# Patient Record
Sex: Female | Born: 1950 | Race: White | Hispanic: No | Marital: Married | Smoking: Never smoker
Health system: Southern US, Community
[De-identification: ages and names within clinical notes are randomized; demographics above are authoritative.]

## PROBLEM LIST (undated history)

## (undated) DIAGNOSIS — E119 Type 2 diabetes mellitus without complications: Secondary | ICD-10-CM

## (undated) DIAGNOSIS — Z85820 Personal history of malignant melanoma of skin: Secondary | ICD-10-CM

## (undated) DIAGNOSIS — M797 Fibromyalgia: Secondary | ICD-10-CM

## (undated) DIAGNOSIS — M1990 Unspecified osteoarthritis, unspecified site: Secondary | ICD-10-CM

## (undated) DIAGNOSIS — I10 Essential (primary) hypertension: Secondary | ICD-10-CM

## (undated) HISTORY — PX: SKIN CANCER EXCISION: SHX779

## (undated) HISTORY — PX: CHOLECYSTECTOMY: SHX55

## (undated) HISTORY — PX: MELANOMA EXCISION: SHX5266

---

## 1997-11-14 ENCOUNTER — Ambulatory Visit (HOSPITAL_COMMUNITY): Admission: RE | Admit: 1997-11-14 | Discharge: 1997-11-14 | Payer: Self-pay | Admitting: Neurosurgery

## 2008-06-14 ENCOUNTER — Ambulatory Visit: Payer: Self-pay | Admitting: Gastroenterology

## 2008-07-03 ENCOUNTER — Ambulatory Visit: Payer: Self-pay | Admitting: Gastroenterology

## 2008-07-03 ENCOUNTER — Ambulatory Visit (HOSPITAL_COMMUNITY): Admission: RE | Admit: 2008-07-03 | Discharge: 2008-07-03 | Payer: Self-pay | Admitting: Gastroenterology

## 2008-07-03 ENCOUNTER — Encounter: Payer: Self-pay | Admitting: Gastroenterology

## 2008-07-05 ENCOUNTER — Encounter (INDEPENDENT_AMBULATORY_CARE_PROVIDER_SITE_OTHER): Payer: Self-pay

## 2008-07-18 ENCOUNTER — Encounter: Payer: Self-pay | Admitting: Gastroenterology

## 2010-08-01 LAB — GLUCOSE, CAPILLARY: Glucose-Capillary: 144 mg/dL — ABNORMAL HIGH (ref 70–99)

## 2010-09-03 NOTE — Consult Note (Signed)
Cassie Martinez, Cassie Martinez            ACCOUNT NO.:  [id number]   MEDICAL RECORD NO.:  [id number]          PATIENT TYPE:  AMB   LOCATION:  DAY                           FACILITY:  [facility]   PHYSICIAN:  Cassie Martinez, M.D.      DATE OF BIRTH:  22-Jan-1951   DATE OF CONSULTATION:  06/14/2008  DATE OF DISCHARGE:                                 CONSULTATION   PRIMARY CARE PHYSICIAN:  Cassie Nations, MD   REASON FOR VISIT:  Rectal bleeding.   HISTORY OF PRESENT ILLNESS:  Cassie Martinez is a 60 year old female who has  never had a colonoscopy.  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  She had rectal bleeding once.  She often has problem with hemorrhoids due to constipation caused by her  pain medicines.  She takes Lortab for her fibromyalgia and back pain.  She denies any change in her bowel habits, weight loss, problems  swallowing, nausea, vomiting, or diarrhea.  She has a bowel movement  every other day.  If she eats right, she has one daily.  She rarely  drinks water.  She denies any black stools.  She uses goody powders less  than once a month for headaches.  She uses ibuprofen less than once a  month.  Her appetite is very good.  Her heartburn is controlled 100%  of the time with omeprazole.  She has never had an upper endoscopy to  screen for Barrett esophagus.   PAST MEDICAL HISTORY:  1. Fibromyalgia.  2. Diabetes for 3 years.  3. Hypertension.  4. Hyperlipidemia.  5. GERD for 8 years.   PAST SURGICAL HISTORY:  1. Cholecystectomy in 2008 by Dr. Cleotis Nipper in [place] due to pain and      gallstones.  2. Left chest excision for melanoma.   ALLERGIES:  No known drug allergies.   MEDICATIONS:  1. Omeprazole 20 mg daily.  2. Triamterene/HCTZ 25/50 daily.  3. Fosinopril 10 mg daily.  4. Glimepiride 2 mg daily.  5. Lovastatin 40 mg at bedtime.  6. Calcium with vitamin D b.i.d.  7. Hydrocodone 5/500 mg 2-4 daily.  8. Ibuprofen as needed.  9.  Vitamin D weekly.   REVIEW OF SYSTEMS:  As per the HPI.  Otherwise, all systems are  negative.   FAMILY HISTORY:  Mother has polyps.  She has no family history of colon  cancer.   SOCIAL HISTORY:  She has been married for 39 years and her youngest  child is 20.  She was a homemaker and a Dispensing optician.  She does not smoke  or drink alcohol.   PHYSICAL EXAMINATION:  VITAL SIGNS:  Weight 218 pounds, height 5 feet 6  inches, temperature 97.9, blood pressure 124/82, and pulse 72.GENERAL:  She is in no apparent distress, alert, and oriented x4.HEENT:  Atraumatic and normocephalic.  Pupils are equal and reactive to light.  Mouth, no oral lesions.  Posterior pharynx without erythema or exudate.  Postsurgical changes in the right lower eyelid.NECK:  Full range of  motion.  No lymphadenopathy.CHEST:  Left chest excision  well  healed.LUNGS:  Clear to auscultation bilaterally.CARDIOVASCULAR:  Regular rhythm.  No murmur.  Normal S1 and S2.ABDOMEN:  Bowel sounds are  present.  Soft, nontender, and nondistended.  No rebound or  guarding.EXTREMITIES:  No cyanosis or edema.NEUROLOGIC:  She has no  focal neurologic deficits.   ASSESSMENT:  Cassie Martinez is a 60 year old female with gastroesophageal  reflux disease, which is fairly well controlled, and has never had  Barrett surveillance.  She has rectal bleeding, which is likely  secondary to hemorrhoids.  The differential diagnoses include colorectal  polyp or malignancy.   RECOMMENDATIONS:  1. Colonoscopy for rectal bleeding and upper endoscopy for GERD.  She      should hold the glimepiride on the day of her procedure.  2. I discussed the different preps that were available as well as the      risk associated with them.  She wants to proceed with the OsmoPrep.      She is asked to maintain hydration.  3. I explained to her, the body of evidence that is out there      regarding Barrett surveillance is rare and that adenocarcinoma      usually  occurs in 61 year old white males, but the risk is not 0%      especially with gastroesophageal reflux disease.  She elected to be      surveyed.  4. She will follow up with me as needed.      Cassie Martinez, M.D.  Electronically Signed     SM/MEDQ  D:  06/14/2008  T:  06/15/2008  Job:  [id number]   cc:   Cassie Martinez  Fax: [phone]

## 2010-09-03 NOTE — Op Note (Signed)
Cassie Martinez, Cassie Martinez            ACCOUNT NO.:  [id number]   MEDICAL RECORD NO.:  [id number]          PATIENT TYPE:  AMB   LOCATION:  DAY                           FACILITY:  [facility]   PHYSICIAN:  Kassie Mends, M.D.      DATE OF BIRTH:  02-Sep-1950   DATE OF PROCEDURE:  07/03/2008  DATE OF DISCHARGE:                               OPERATIVE REPORT   REFERRING Lathan Gieselman:  Kathlee Nations, MD, [place], [place].   PROCEDURE:  1. Ileocolonoscopy.  2. Esophagogastroduodenoscopy with cold forceps biopsy.   INDICATION FOR EXAM:  Cassie Martinez is a 60 year old female who presented  with rectal bleeding and a long history of heartburn.  Her heartburn was  well controlled and she never had surveillance for Barrett esophagus.   FINDINGS:  1. Normal terminal ileum approximately 10 cm visualized.  2. Normal colon without evidence of polyps, masses, inflammatory      changes, or diverticular AVMs.  3. Small internal hemorrhoids.  Otherwise, normal retroflex view of      the rectum.  4. Normal esophagus without evidence of Barrett, erosion, ulceration,      or stricture.  5. Moderate sliding hiatal hernia.  Multiple gastric polyps seen in      the proximal stomach.  The largest appeared to be approximately 8      mm.  Biopsies were obtained via cold forceps.  6. Patchy erythema in the antrum with rare erosion.  No ulcerations      seen.  7. Normal duodenal bulb in second portion of the duodenum.   DIAGNOSES:  1. Small internal hemorrhoids.  2. Gastric polyps, likely fundic gland polyps.  3. Moderate gastritis likely secondary to anti-inflammatory use.   RECOMMENDATIONS:  1. Will call Cassie Martinez with results of her biopsies.  2. No aspirin, NSAIDs, or anticoagulation for 7 days.  3. She was given a handout on gastritis and hiatal hernia.  4. She should follow a high-fiber diet.  She was given a handout on      high-fiber diet and hemorrhoids.  5. Screening colonoscopy in 10 years.   MEDICATIONS:  1. Demerol 100 mg IV.  2. Versed 7 mg IV.   PROCEDURE TECHNIQUE:  Physical exam was performed.  Informed consent was  obtained from the patient explaining the benefits, risks, and  alternatives to the procedure.  The patient was connected to monitor and  placed in left lateral position.  Continuous oxygen was provided by  nasal cannula and IV medicine administered through an indwelling  cannula.  After administration of sedation and rectal exam, the  patient's rectum was intubated.  The scope was advanced under direct  visualization to the distal terminal ileum.  The scope was removed  slowly by carefully examining the color, texture, anatomy, and integrity  mucosa on the way out.   After colonoscopy, the patient's esophagus was intubated with a  diagnostic gastroscope.  The scope advanced under direct visualization  to the second portion of the duodenum.  The scope was removed slowly by  careful examining the color, texture, anatomy, and integrity mucosa on  the way out.  The patient was recovered in Endoscopy and discharged home  in satisfactory condition.   PATH:  Chronic gastritis. Fundic gland polyps.      Kassie Mends, M.D.  Electronically Signed     SM/MEDQ  D:  07/03/2008  T:  07/03/2008  Job:  [id number]   cc:   Kathlee Nations  Fax: [phone]

## 2013-09-02 ENCOUNTER — Emergency Department (HOSPITAL_COMMUNITY): Payer: PRIVATE HEALTH INSURANCE

## 2013-09-02 ENCOUNTER — Encounter (HOSPITAL_COMMUNITY): Payer: Self-pay | Admitting: Emergency Medicine

## 2013-09-02 ENCOUNTER — Emergency Department (HOSPITAL_COMMUNITY)
Admission: EM | Admit: 2013-09-02 | Discharge: 2013-09-02 | Disposition: A | Discharge status: Home / Self Care | Payer: PRIVATE HEALTH INSURANCE | Attending: Emergency Medicine | Admitting: Emergency Medicine

## 2013-09-02 DIAGNOSIS — Z8739 Personal history of other diseases of the musculoskeletal system and connective tissue: Secondary | ICD-10-CM | POA: Insufficient documentation

## 2013-09-02 DIAGNOSIS — Y9301 Activity, walking, marching and hiking: Secondary | ICD-10-CM | POA: Insufficient documentation

## 2013-09-02 DIAGNOSIS — S52501A Unspecified fracture of the lower end of right radius, initial encounter for closed fracture: Secondary | ICD-10-CM | POA: Diagnosis present

## 2013-09-02 DIAGNOSIS — E119 Type 2 diabetes mellitus without complications: Secondary | ICD-10-CM | POA: Insufficient documentation

## 2013-09-02 DIAGNOSIS — Y92838 Other recreation area as the place of occurrence of the external cause: Secondary | ICD-10-CM

## 2013-09-02 DIAGNOSIS — IMO0002 Reserved for concepts with insufficient information to code with codable children: Secondary | ICD-10-CM | POA: Insufficient documentation

## 2013-09-02 DIAGNOSIS — S52509A Unspecified fracture of the lower end of unspecified radius, initial encounter for closed fracture: Secondary | ICD-10-CM | POA: Insufficient documentation

## 2013-09-02 DIAGNOSIS — W010XXA Fall on same level from slipping, tripping and stumbling without subsequent striking against object, initial encounter: Secondary | ICD-10-CM | POA: Insufficient documentation

## 2013-09-02 DIAGNOSIS — S52611A Displaced fracture of right ulna styloid process, initial encounter for closed fracture: Secondary | ICD-10-CM | POA: Diagnosis present

## 2013-09-02 DIAGNOSIS — Y9239 Other specified sports and athletic area as the place of occurrence of the external cause: Secondary | ICD-10-CM | POA: Insufficient documentation

## 2013-09-02 DIAGNOSIS — S52609A Unspecified fracture of lower end of unspecified ulna, initial encounter for closed fracture: Principal | ICD-10-CM

## 2013-09-02 DIAGNOSIS — I10 Essential (primary) hypertension: Secondary | ICD-10-CM | POA: Insufficient documentation

## 2013-09-02 HISTORY — DX: Unspecified osteoarthritis, unspecified site: M19.90

## 2013-09-02 HISTORY — DX: Type 2 diabetes mellitus without complications: E11.9

## 2013-09-02 HISTORY — DX: Essential (primary) hypertension: I10

## 2013-09-02 MED ORDER — HYDROMORPHONE HCL PF 1 MG/ML IJ SOLN
0.5000 mg | Freq: Once | INTRAMUSCULAR | Status: AC
  Administered 2013-09-02: 0.5 mg via INTRAMUSCULAR
  Filled 2013-09-02: qty 1

## 2013-09-02 MED ORDER — OXYCODONE HCL 5 MG PO CAPS: 10.0000 mg | ORAL_CAPSULE | ORAL | Status: DC | PRN

## 2013-09-02 MED ORDER — OXYCODONE HCL 5 MG PO CAPS: 5.0000 mg | ORAL_CAPSULE | ORAL | Status: DC | PRN

## 2013-09-02 MED ORDER — METHOCARBAMOL 750 MG PO TABS: 750.0000 mg | ORAL_TABLET | Freq: Four times a day (QID) | ORAL | Status: DC

## 2013-09-02 MED ORDER — METHOCARBAMOL 750 MG PO TABS: 750.0000 mg | ORAL_TABLET | Freq: Four times a day (QID) | ORAL | Status: AC

## 2013-09-02 NOTE — Consult Note (Signed)
Reason for Consult: Fracture right wrist Referring Physician: ER staff Dr. Hayden RasmussenHarrison  Cassie Lynnea FerrierSolomon is an 63 y.o. female.  HPI: Patient presents after a fall onto her outstretched extremity prints his right upper extremity) she's. Patient injured her facial region with abrasion and contusion to her nose and lip. She also sustained a fracture to her radius. She notes no instability or prior history of injury to the wrist that she is aware of. She denies abdominal pain chest pain neck or back pain she denies lower extremity pain.  She was on a field trip with her granddaughter and fell as she tripped over a root.  She is otherwise healthy. She is delightful and from [place]  Past Medical History  Diagnosis Date  . Arthritis   . Hypertension   . Diabetes mellitus without complication     History reviewed. No pertinent past surgical history.  History reviewed. No pertinent family history.  Social History:  reports that she has never smoked. She does not have any smokeless tobacco history on file. She reports that she does not drink alcohol or use illicit drugs.  Allergies: No Known Allergies  Medications: I have reviewed the patient's current medications.  No results found for this or any previous visit (from the past 48 hour(s)).  Dg Forearm Right  09/02/2013   CLINICAL DATA:  Pain post trauma  EXAM: RIGHT FOREARM - 2 VIEW  COMPARISON:  Right wrist obtained earlier in the day  FINDINGS: Frontal and lateral views were obtained. There is a comminuted fracture of the distal radial metaphysis mild impaction as well as lateral displacement distal fracture fragments and mouth volar angulation distally. Ulnar styloid is avulsed. No other fractures. No dislocation. No evidence of elbow joint effusion.  IMPRESSION: Comminuted fracture distal radius.  Avulsion ulnar styloid.   Electronically Signed   By: Bretta BangWilliam  Woodruff M.D.   On: 09/02/2013 14:57   Dg Wrist Complete  Right  09/02/2013   CLINICAL DATA:  Pain post trauma  EXAM: RIGHT WRIST - COMPLETE 3+ VIEW  COMPARISON:  None.  FINDINGS: Frontal, oblique, lateral, and ulnar deviation scaphoid images were obtained. There is a comminuted fracture the distal radial metaphysis with mild lateral displacement of the distal major fracture fragments and mild impaction and volar angulation at the fracture site. There are fractures extending into the radiocarpal joint. There is avulsion of the ulnar styloid. No dislocation. There is osteoarthritic change in the scaphotrapezial and saddle joints.  IMPRESSION: Comminuted fracture distal radius. Avulsion ulnar styloid. No dislocation.   Electronically Signed   By: Bretta BangWilliam  Woodruff M.D.   On: 09/02/2013 14:56   Dg Hand Complete Right  09/02/2013   CLINICAL DATA:  Pain post trauma  EXAM: RIGHT HAND - COMPLETE 3+ VIEW  COMPARISON:  None.  FINDINGS: Frontal, oblique, and lateral views were obtained. There is a comminuted fracture of the distal radial metaphysis. There is avulsion of the ulnar styloid. It the hand region, there is no appreciable fracture or dislocation. There is mild to moderate narrowing of all PIP and DIP joints. There is osteoarthritic change in the scaphotrapezial and saddle joints.  IMPRESSION: Fractures of the distal radius and ulna. Multilevel osteoarthritic change.   Electronically Signed   By: Bretta BangWilliam  Woodruff M.D.   On: 09/02/2013 14:54    Review of Systems  Constitutional: Negative.   HENT: Negative.   Eyes: Negative.   Respiratory: Negative.   Cardiovascular: Negative.   Gastrointestinal: Negative.   Neurological: Negative.  Psychiatric/Behavioral: Negative.    Blood pressure 146/70, pulse 86, temperature 97.6 F (36.4 C), temperature source Oral, resp. rate 15, SpO2 96.00%. Physical Exam white female with facial contusions and abrasions. She has swelling about her nose and lip. There is no difficulty exchanging air no shortness of breath or  wheeze.  She has a swollen painful wrist right upper extremity no laceration or abrasion I washed this with soap and water and treated the skin. Elbow and upper arm or nontender she has normal sensation normal pulse no signs of compartment syndrome or dystrophic reaction  Chest is clear HEENT is reviewed at length as noted above. Abdomen is nontender nondistended obese. Lower stem examination is benign without palpable deformity or abnormality.  Pelvis is stable.  Left upper extremity is neurovascular intact    Assessment/Plan: #1 Comminuted right wrist fracture will plan for CT scan splinting (this was done today by myself) and I will call her and arrange for elective reconstruction. #2 facial trauma with contusion and abrasions  I discussed the patient all issues. She understands we will treat the face with Neosporin and topical measures. For the wrist all 10 a CT scan g tonight on call after I review the CT scan we'll make definitive care plans. She understands we'll likely plan to proceed with surgical stabilization given her fractures  Pattern  Keep bandage clean and dry.  Call for any problems.  No smoking.  Criteria for driving a car: you should be off your pain medicine for 7-8 hours, able to drive one handed(confident), thinking clearly and feeling able in your judgement to drive. Continue elevation as it will decrease swelling.  If instructed by MD move your fingers within the confines of the bandage/splint.  Use ice if instructed by your MD. Call immediately for any sudden loss of feeling in your hand/arm or change in functional abilities of the extremity.  We recommend that you to take vitamin C 1000 mg a day to promote healing we also recommend that if you require her pain medicine that he take a stool softener to prevent constipation as most pain medicines will have constipation side effects. We recommend either Peri-Colace or Senokot and recommend that you also consider adding  MiraLAX to prevent the constipation affects from pain medicine if you are required to use them. These medicines are over the counter and maybe purchased at a local pharmacy.   Dominica SeverinWilliam Tejay Hubert 09/02/2013, 4:41 PM

## 2013-09-02 NOTE — ED Notes (Signed)
Pt transported to CT ?

## 2013-09-02 NOTE — Discharge Instructions (Signed)
Please elevate keep your bandage clean and dry and move your fingers frequently. We recommend ice and elevation and finger movement and massage to decrease edema/swelling.  We recommend a stool softener for your GI system to prevent constipation while on pain medicine.  Dr. Amanda PeaGramig will call you after he reviews her CT scan  We recommend that you to take vitamin C 1000 mg a day to promote healing we also recommend that if you require her pain medicine that he take a stool softener to prevent constipation as most pain medicines will have constipation side effects. We recommend either Peri-Colace or Senokot and recommend that you also consider adding MiraLAX to prevent the constipation affects from pain medicine if you are required to use them. These medicines are over the counter and maybe purchased at a local pharmacy.   Keep bandage clean and dry.  Call for any problems.  No smoking.  Criteria for driving a car: you should be off your pain medicine for 7-8 hours, able to drive one handed(confident), thinking clearly and feeling able in your judgement to drive. Continue elevation as it will decrease swelling.  If instructed by MD move your fingers within the confines of the bandage/splint.  Use ice if instructed by your MD. Call immediately for any sudden loss of feeling in your hand/arm or change in functional abilities of the extremity.  Please continue to apply Neosporin and large with soap and water to your facial lacerations and abrasions

## 2013-09-02 NOTE — ED Provider Notes (Signed)
CSN: [id number]     Arrival date & time 09/02/13  1328 History   First MD Initiated Contact with Patient 09/02/13 1333     Chief Complaint  Patient presents with  . Wrist Injury     (Consider location/radiation/quality/duration/timing/severity/associated sxs/prior Treatment) Patient is a 63 y.o. female presenting with wrist injury. The history is provided by the patient.  Wrist Injury Location:  Wrist Time since incident:  1 hour Injury: yes   Mechanism of injury: fall   Fall:    Fall occurred: from standing.   Impact surface: ground.   Point of impact: right hand, face.   Entrapped after fall: no   Wrist location:  R wrist Pain details:    Quality:  Aching   Radiates to:  Does not radiate   Severity:  Mild   Onset quality:  Sudden   Duration:  1 hour   Timing:  Constant   Progression:  Unchanged Chronicity:  New Tetanus status:  Up to date Prior injury to area:  No Relieved by: tramadol x 2. Worsened by:  Nothing tried Ineffective treatments:  None tried Associated symptoms: no back pain, no fatigue, no fever and no neck pain     Past Medical History  Diagnosis Date  . Arthritis   . Hypertension   . Diabetes mellitus without complication    History reviewed. No pertinent past surgical history. History reviewed. No pertinent family history. History  Substance Use Topics  . Smoking status: Never Smoker   . Smokeless tobacco: Not on file  . Alcohol Use: No   OB History   Grav Para Term Preterm Abortions TAB SAB Ect Mult Living                 Review of Systems  Constitutional: Negative for fever and fatigue.  HENT: Negative for congestion and drooling.   Eyes: Negative for pain.  Respiratory: Negative for cough and shortness of breath.   Cardiovascular: Negative for chest pain.  Gastrointestinal: Negative for nausea, vomiting, abdominal pain and diarrhea.  Genitourinary: Negative for dysuria and hematuria.  Musculoskeletal: Negative for back pain, gait  problem and neck pain.  Skin: Negative for color change.  Neurological: Negative for dizziness and headaches.  Hematological: Negative for adenopathy.  Psychiatric/Behavioral: Negative for behavioral problems.  All other systems reviewed and are negative.     Allergies  Review of patient's allergies indicates no known allergies.  Home Medications   Prior to Admission medications   Not on File   BP 141/65  Pulse 92  Temp(Src) 97.9 F (36.6 C) (Oral)  Resp 16  SpO2 96% Physical Exam  Nursing note and vitals reviewed. Constitutional: She is oriented to person, place, and time. She appears well-developed and well-nourished.  HENT:  Head: Normocephalic.  Right Ear: External ear normal.  Left Ear: External ear normal.  Mouth/Throat: Oropharynx is clear and moist. No oropharyngeal exudate.  Mild abrasion to the bridge of the nose. No septal hematoma noted.  Mild swelling and abrasion to the right upper lip.  Normal dentition and appearance of the oropharynx.  No focal tenderness to palpation of the face.  Eyes: Conjunctivae and EOM are normal. Pupils are equal, round, and reactive to light.  Neck: Normal range of motion. Neck supple.  No vertebral tenderness to palpation.  Cardiovascular: Normal rate, regular rhythm, normal heart sounds and intact distal pulses.  Exam reveals no gallop and no friction rub.   No murmur heard. Pulmonary/Chest: Effort normal and breath sounds  normal. No respiratory distress. She has no wheezes.  Abdominal: Soft. Bowel sounds are normal. There is no tenderness. There is no rebound and no guarding.  Musculoskeletal: Normal range of motion. She exhibits no edema and no tenderness.  Gross deformity to the right wrist.  Motor skills and sensation intact in the right upper extremity.  2+ distal pulses in the right upper extremity.  Neurological: She is alert and oriented to person, place, and time.  Skin: Skin is warm and dry.  Psychiatric: She  has a normal mood and affect. Her behavior is normal.    ED Course  Procedures (including critical care time) Labs Review Labs Reviewed - No data to display  Imaging Review Dg Forearm Right  09/02/2013   CLINICAL DATA:  Pain post trauma  EXAM: RIGHT FOREARM - 2 VIEW  COMPARISON:  Right wrist obtained earlier in the day  FINDINGS: Frontal and lateral views were obtained. There is a comminuted fracture of the distal radial metaphysis mild impaction as well as lateral displacement distal fracture fragments and mouth volar angulation distally. Ulnar styloid is avulsed. No other fractures. No dislocation. No evidence of elbow joint effusion.  IMPRESSION: Comminuted fracture distal radius.  Avulsion ulnar styloid.   Electronically Signed   By: Bretta Bang M.D.   On: 09/02/2013 14:57   Dg Wrist Complete Right  09/02/2013   CLINICAL DATA:  Pain post trauma  EXAM: RIGHT WRIST - COMPLETE 3+ VIEW  COMPARISON:  None.  FINDINGS: Frontal, oblique, lateral, and ulnar deviation scaphoid images were obtained. There is a comminuted fracture the distal radial metaphysis with mild lateral displacement of the distal major fracture fragments and mild impaction and volar angulation at the fracture site. There are fractures extending into the radiocarpal joint. There is avulsion of the ulnar styloid. No dislocation. There is osteoarthritic change in the scaphotrapezial and saddle joints.  IMPRESSION: Comminuted fracture distal radius. Avulsion ulnar styloid. No dislocation.   Electronically Signed   By: Bretta Bang M.D.   On: 09/02/2013 14:56   Dg Hand Complete Right  09/02/2013   CLINICAL DATA:  Pain post trauma  EXAM: RIGHT HAND - COMPLETE 3+ VIEW  COMPARISON:  None.  FINDINGS: Frontal, oblique, and lateral views were obtained. There is a comminuted fracture of the distal radial metaphysis. There is avulsion of the ulnar styloid. It the hand region, there is no appreciable fracture or dislocation. There is  mild to moderate narrowing of all PIP and DIP joints. There is osteoarthritic change in the scaphotrapezial and saddle joints.  IMPRESSION: Fractures of the distal radius and ulna. Multilevel osteoarthritic change.   Electronically Signed   By: Bretta Bang M.D.   On: 09/02/2013 14:54     EKG Interpretation None      MDM   Final diagnoses:  Distal radius fracture, right  Fracture of right ulnar styloid    1:43 PM 63 y.o. female who presents after a mechanical fall in the park. She fell forward breaking herself with her outstretched hands. She has an abrasion to the bridge of her nose and a deformity to her right wrist. She denies any headache or neck pain. She is afebrile and vital signs are unremarkable here. No gross deformity to nose without significant tenderness to palpation. Will get screening imaging of the wrist do not think facial imaging is needed. Tetanus UTD per pt.   Discussed case w/ Dr. Amanda Pea who will see the pt in the ED. Return precautions provided. Pt to  f/u w/ Dr. Amanda PeaGramig.     Junius ArgyleForrest S Deryl Ports, MD 09/05/13 [id number]

## 2013-09-02 NOTE — ED Notes (Addendum)
Per EMS pt reports walking through park and tripped over tree stump, reports trying to use right wrist to break her fall. Pt has laceration to lip and nose.

## 2013-09-02 NOTE — ED Notes (Signed)
Bed: [id number] Expected date:  Expected time:  Means of arrival:  Comments: ems- elderly, fall, wrist injury

## 2013-09-02 NOTE — ED Notes (Signed)
Pt in X ray

## 2013-09-03 ENCOUNTER — Encounter (HOSPITAL_COMMUNITY): Payer: No Typology Code available for payment source | Admitting: Anesthesiology

## 2013-09-03 ENCOUNTER — Ambulatory Visit (HOSPITAL_COMMUNITY): Payer: No Typology Code available for payment source | Admitting: Anesthesiology

## 2013-09-03 ENCOUNTER — Encounter (HOSPITAL_COMMUNITY)
Admission: EM | Disposition: A | Discharge status: Home / Self Care | Payer: Self-pay | Source: Ambulatory Visit | Attending: Orthopedic Surgery

## 2013-09-03 ENCOUNTER — Observation Stay (HOSPITAL_COMMUNITY)
Admission: EM | Admit: 2013-09-03 | Discharge: 2013-09-04 | Disposition: A | Discharge status: Home / Self Care | Payer: No Typology Code available for payment source | Source: Ambulatory Visit | Attending: Orthopedic Surgery | Admitting: Orthopedic Surgery

## 2013-09-03 ENCOUNTER — Encounter (HOSPITAL_COMMUNITY): Payer: Self-pay | Admitting: *Deleted

## 2013-09-03 DIAGNOSIS — I509 Heart failure, unspecified: Secondary | ICD-10-CM | POA: Insufficient documentation

## 2013-09-03 DIAGNOSIS — S52509A Unspecified fracture of the lower end of unspecified radius, initial encounter for closed fracture: Principal | ICD-10-CM | POA: Insufficient documentation

## 2013-09-03 DIAGNOSIS — E119 Type 2 diabetes mellitus without complications: Secondary | ICD-10-CM | POA: Insufficient documentation

## 2013-09-03 DIAGNOSIS — IMO0001 Reserved for inherently not codable concepts without codable children: Secondary | ICD-10-CM | POA: Insufficient documentation

## 2013-09-03 DIAGNOSIS — S52609A Unspecified fracture of lower end of unspecified ulna, initial encounter for closed fracture: Principal | ICD-10-CM

## 2013-09-03 DIAGNOSIS — W19XXXA Unspecified fall, initial encounter: Secondary | ICD-10-CM | POA: Diagnosis not present

## 2013-09-03 DIAGNOSIS — I10 Essential (primary) hypertension: Secondary | ICD-10-CM | POA: Insufficient documentation

## 2013-09-03 DIAGNOSIS — I252 Old myocardial infarction: Secondary | ICD-10-CM | POA: Insufficient documentation

## 2013-09-03 DIAGNOSIS — S52599A Other fractures of lower end of unspecified radius, initial encounter for closed fracture: Secondary | ICD-10-CM | POA: Diagnosis present

## 2013-09-03 DIAGNOSIS — S52501A Unspecified fracture of the lower end of right radius, initial encounter for closed fracture: Secondary | ICD-10-CM

## 2013-09-03 HISTORY — PX: ORIF WRIST FRACTURE: SHX2133

## 2013-09-03 HISTORY — DX: Fibromyalgia: M79.7

## 2013-09-03 HISTORY — DX: Personal history of malignant melanoma of skin: Z85.820

## 2013-09-03 LAB — GLUCOSE, CAPILLARY
GLUCOSE-CAPILLARY: 148 mg/dL — AB (ref 70–99)
Glucose-Capillary: 153 mg/dL — ABNORMAL HIGH (ref 70–99)
Glucose-Capillary: 138 mg/dL — ABNORMAL HIGH (ref 70–99)

## 2013-09-03 SURGERY — OPEN REDUCTION INTERNAL FIXATION (ORIF) WRIST FRACTURE
Anesthesia: Monitor Anesthesia Care | Laterality: Right

## 2013-09-03 MED ORDER — CEFAZOLIN SODIUM-DEXTROSE 2-3 GM-% IV SOLR
INTRAVENOUS | Status: AC
  Filled 2013-09-03: qty 50

## 2013-09-03 MED ORDER — LIDOCAINE HCL (CARDIAC) 20 MG/ML IV SOLN
INTRAVENOUS | Status: AC
  Filled 2013-09-03: qty 5

## 2013-09-03 MED ORDER — ACETAMINOPHEN 325 MG PO TABS: 325.0000 mg | ORAL_TABLET | ORAL | Status: DC | PRN

## 2013-09-03 MED ORDER — PROPOFOL 10 MG/ML IV BOLUS
INTRAVENOUS | Status: DC | PRN
  Administered 2013-09-03 (×2): 20 mg via INTRAVENOUS

## 2013-09-03 MED ORDER — ONDANSETRON HCL 4 MG PO TABS
4.0000 mg | ORAL_TABLET | Freq: Four times a day (QID) | ORAL | Status: DC | PRN
  Administered 2013-09-04: 4 mg via ORAL
  Filled 2013-09-03: qty 1

## 2013-09-03 MED ORDER — LACTATED RINGERS IV SOLN
INTRAVENOUS | Status: DC
  Administered 2013-09-03: 12:00:00 via INTRAVENOUS

## 2013-09-03 MED ORDER — METHOCARBAMOL 1000 MG/10ML IJ SOLN
500.0000 mg | Freq: Four times a day (QID) | INTRAVENOUS | Status: DC | PRN
  Filled 2013-09-03: qty 5

## 2013-09-03 MED ORDER — PROMETHAZINE HCL 25 MG/ML IJ SOLN: 6.2500 mg | INTRAMUSCULAR | Status: DC | PRN

## 2013-09-03 MED ORDER — CEFAZOLIN SODIUM 1-5 GM-% IV SOLN
INTRAVENOUS | Status: AC
  Administered 2013-09-03: 1 g via INTRAVENOUS
  Filled 2013-09-03: qty 50

## 2013-09-03 MED ORDER — OXYCODONE HCL 5 MG PO TABS
ORAL_TABLET | ORAL | Status: AC
Start: 2013-09-03 — End: 2013-09-03
  Administered 2013-09-03: 5 mg via ORAL
  Filled 2013-09-03: qty 1

## 2013-09-03 MED ORDER — FENTANYL CITRATE 0.05 MG/ML IJ SOLN: 25.0000 ug | INTRAMUSCULAR | Status: DC | PRN

## 2013-09-03 MED ORDER — KETOROLAC TROMETHAMINE 30 MG/ML IJ SOLN
INTRAMUSCULAR | Status: AC
  Administered 2013-09-03: 30 mg via INTRAVENOUS
  Filled 2013-09-03: qty 1

## 2013-09-03 MED ORDER — DIPHENHYDRAMINE HCL 25 MG PO CAPS: 25.0000 mg | ORAL_CAPSULE | Freq: Four times a day (QID) | ORAL | Status: DC | PRN

## 2013-09-03 MED ORDER — CEFAZOLIN SODIUM 1-5 GM-% IV SOLN
1.0000 g | INTRAVENOUS | Status: AC
  Administered 2013-09-03: 1 g via INTRAVENOUS

## 2013-09-03 MED ORDER — ONDANSETRON HCL 4 MG/2ML IJ SOLN: 4.0000 mg | Freq: Four times a day (QID) | INTRAMUSCULAR | Status: DC | PRN

## 2013-09-03 MED ORDER — SIMVASTATIN 20 MG PO TABS
20.0000 mg | ORAL_TABLET | Freq: Every day | ORAL | Status: DC
Start: 2013-09-03 — End: 2013-09-04
  Administered 2013-09-03: 20 mg via ORAL
  Filled 2013-09-03 (×2): qty 1

## 2013-09-03 MED ORDER — LACTATED RINGERS IV SOLN
INTRAVENOUS | Status: DC | PRN
  Administered 2013-09-03: 15:00:00 via INTRAVENOUS

## 2013-09-03 MED ORDER — CEFAZOLIN SODIUM 1-5 GM-% IV SOLN
1.0000 g | Freq: Three times a day (TID) | INTRAVENOUS | Status: DC
  Administered 2013-09-03 – 2013-09-04 (×3): 1 g via INTRAVENOUS
  Filled 2013-09-03 (×5): qty 50

## 2013-09-03 MED ORDER — LISINOPRIL 10 MG PO TABS
10.0000 mg | ORAL_TABLET | Freq: Every day | ORAL | Status: DC
  Administered 2013-09-04: 10 mg via ORAL
  Filled 2013-09-03: qty 1

## 2013-09-03 MED ORDER — MIDAZOLAM HCL 2 MG/2ML IJ SOLN
INTRAMUSCULAR | Status: AC
  Filled 2013-09-03: qty 2

## 2013-09-03 MED ORDER — MIDAZOLAM HCL 5 MG/5ML IJ SOLN
INTRAMUSCULAR | Status: DC | PRN
  Administered 2013-09-03: 2 mg via INTRAVENOUS

## 2013-09-03 MED ORDER — CEFAZOLIN SODIUM-DEXTROSE 2-3 GM-% IV SOLR
INTRAVENOUS | Status: DC | PRN
  Administered 2013-09-03: 2 g via INTRAVENOUS

## 2013-09-03 MED ORDER — FENTANYL CITRATE 0.05 MG/ML IJ SOLN
INTRAMUSCULAR | Status: DC | PRN
  Administered 2013-09-03: 25 ug via INTRAVENOUS
  Administered 2013-09-03: 50 ug via INTRAVENOUS
  Administered 2013-09-03: 100 ug via INTRAVENOUS
  Administered 2013-09-03 (×3): 25 ug via INTRAVENOUS

## 2013-09-03 MED ORDER — ACETAMINOPHEN 160 MG/5ML PO SOLN
325.0000 mg | ORAL | Status: DC | PRN
  Filled 2013-09-03: qty 20.3

## 2013-09-03 MED ORDER — METHOCARBAMOL 500 MG PO TABS
ORAL_TABLET | ORAL | Status: AC
  Administered 2013-09-03: 500 mg via ORAL
  Filled 2013-09-03: qty 1

## 2013-09-03 MED ORDER — ROPIVACAINE HCL 5 MG/ML IJ SOLN
INTRAMUSCULAR | Status: DC | PRN
  Administered 2013-09-03: 25 mL via PERINEURAL

## 2013-09-03 MED ORDER — OXYCODONE HCL 5 MG PO TABS
5.0000 mg | ORAL_TABLET | ORAL | Status: DC | PRN
  Administered 2013-09-03 – 2013-09-04 (×4): 10 mg via ORAL
  Filled 2013-09-03 (×4): qty 2

## 2013-09-03 MED ORDER — PANTOPRAZOLE SODIUM 40 MG PO TBEC: 40.0000 mg | DELAYED_RELEASE_TABLET | Freq: Two times a day (BID) | ORAL | Status: DC | PRN

## 2013-09-03 MED ORDER — OXYCODONE HCL 5 MG PO TABS
5.0000 mg | ORAL_TABLET | Freq: Once | ORAL | Status: AC | PRN
  Administered 2013-09-03: 5 mg via ORAL

## 2013-09-03 MED ORDER — ADULT MULTIVITAMIN W/MINERALS CH
1.0000 | ORAL_TABLET | Freq: Every day | ORAL | Status: DC
  Administered 2013-09-03 – 2013-09-04 (×2): 1 via ORAL
  Filled 2013-09-03 (×2): qty 1

## 2013-09-03 MED ORDER — KETOROLAC TROMETHAMINE 30 MG/ML IJ SOLN
15.0000 mg | Freq: Once | INTRAMUSCULAR | Status: AC | PRN
  Administered 2013-09-03: 30 mg via INTRAVENOUS

## 2013-09-03 MED ORDER — PROPOFOL 10 MG/ML IV BOLUS
INTRAVENOUS | Status: AC
  Filled 2013-09-03: qty 20

## 2013-09-03 MED ORDER — ALPRAZOLAM 0.5 MG PO TABS
0.5000 mg | ORAL_TABLET | Freq: Four times a day (QID) | ORAL | Status: DC | PRN
  Administered 2013-09-04: 0.5 mg via ORAL
  Filled 2013-09-03: qty 1

## 2013-09-03 MED ORDER — METHOCARBAMOL 500 MG PO TABS
500.0000 mg | ORAL_TABLET | Freq: Four times a day (QID) | ORAL | Status: DC | PRN
  Administered 2013-09-03 – 2013-09-04 (×4): 500 mg via ORAL
  Filled 2013-09-03 (×4): qty 1

## 2013-09-03 MED ORDER — SENNA 8.6 MG PO TABS
1.0000 | ORAL_TABLET | Freq: Two times a day (BID) | ORAL | Status: DC
  Administered 2013-09-03 – 2013-09-04 (×2): 8.6 mg via ORAL
  Filled 2013-09-03 (×3): qty 1

## 2013-09-03 MED ORDER — VITAMIN C 500 MG PO TABS
1000.0000 mg | ORAL_TABLET | Freq: Every day | ORAL | Status: DC
  Administered 2013-09-03 – 2013-09-04 (×2): 1000 mg via ORAL
  Filled 2013-09-03 (×2): qty 2

## 2013-09-03 MED ORDER — LIDOCAINE HCL (CARDIAC) 20 MG/ML IV SOLN
INTRAVENOUS | Status: DC | PRN
  Administered 2013-09-03: 40 mg via INTRAVENOUS

## 2013-09-03 MED ORDER — PROPOFOL INFUSION 10 MG/ML OPTIME
INTRAVENOUS | Status: DC | PRN
  Administered 2013-09-03: 75 ug/kg/min via INTRAVENOUS

## 2013-09-03 MED ORDER — TRIAMTERENE-HCTZ 75-50 MG PO TABS
1.0000 | ORAL_TABLET | Freq: Every day | ORAL | Status: DC
  Administered 2013-09-04: 1 via ORAL
  Filled 2013-09-03: qty 1

## 2013-09-03 MED ORDER — MORPHINE SULFATE 2 MG/ML IJ SOLN: 1.0000 mg | INTRAMUSCULAR | Status: DC | PRN

## 2013-09-03 MED ORDER — SODIUM CHLORIDE 0.45 % IV SOLN
INTRAVENOUS | Status: DC
  Administered 2013-09-03: 19:00:00 via INTRAVENOUS

## 2013-09-03 MED ORDER — GLIMEPIRIDE 4 MG PO TABS
4.0000 mg | ORAL_TABLET | Freq: Every day | ORAL | Status: DC
  Administered 2013-09-04: 4 mg via ORAL
  Filled 2013-09-03 (×2): qty 1

## 2013-09-03 MED ORDER — BUPIVACAINE HCL (PF) 0.25 % IJ SOLN
INTRAMUSCULAR | Status: AC
  Filled 2013-09-03: qty 30

## 2013-09-03 MED ORDER — OXYCODONE HCL 5 MG/5ML PO SOLN: 5.0000 mg | Freq: Once | ORAL | Status: AC | PRN

## 2013-09-03 SURGICAL SUPPLY — 55 items
BANDAGE ELASTIC 3 VELCRO ST LF (GAUZE/BANDAGES/DRESSINGS) ×3 IMPLANT
BANDAGE ELASTIC 4 VELCRO ST LF (GAUZE/BANDAGES/DRESSINGS) ×3 IMPLANT
BANDAGE GAUZE ELAST BULKY 4 IN (GAUZE/BANDAGES/DRESSINGS) ×9 IMPLANT
BLADE 10 SAFETY STRL DISP (BLADE) ×3 IMPLANT
BLADE SURG ROTATE 9660 (MISCELLANEOUS) IMPLANT
BNDG CMPR 9X4 STRL LF SNTH (GAUZE/BANDAGES/DRESSINGS) ×1
BNDG ESMARK 4X9 LF (GAUZE/BANDAGES/DRESSINGS) ×3 IMPLANT
BNDG GAUZE ELAST 4 BULKY (GAUZE/BANDAGES/DRESSINGS) ×2 IMPLANT
CORDS BIPOLAR (ELECTRODE) ×3 IMPLANT
COVER SURGICAL LIGHT HANDLE (MISCELLANEOUS) ×3 IMPLANT
CUFF TOURNIQUET SINGLE 18IN (TOURNIQUET CUFF) ×3 IMPLANT
CUFF TOURNIQUET SINGLE 24IN (TOURNIQUET CUFF) IMPLANT
DRAIN TLS ROUND 10FR (DRAIN) IMPLANT
DRAPE OEC MINIVIEW 54X84 (DRAPES) IMPLANT
DRAPE SURG 17X23 STRL (DRAPES) ×3 IMPLANT
DRSG ADAPTIC 3X8 NADH LF (GAUZE/BANDAGES/DRESSINGS) ×2 IMPLANT
GAUZE XEROFORM 1X8 LF (GAUZE/BANDAGES/DRESSINGS) ×3 IMPLANT
GAUZE XEROFORM 5X9 LF (GAUZE/BANDAGES/DRESSINGS) ×2 IMPLANT
GLOVE BIOGEL M STRL SZ7.5 (GLOVE) ×3 IMPLANT
GLOVE SS BIOGEL STRL SZ 8 (GLOVE) ×1 IMPLANT
GLOVE SUPERSENSE BIOGEL SZ 8 (GLOVE) ×2
GOWN STRL REUS W/ TWL LRG LVL3 (GOWN DISPOSABLE) ×3 IMPLANT
GOWN STRL REUS W/ TWL XL LVL3 (GOWN DISPOSABLE) ×3 IMPLANT
GOWN STRL REUS W/TWL LRG LVL3 (GOWN DISPOSABLE) ×9
GOWN STRL REUS W/TWL XL LVL3 (GOWN DISPOSABLE) ×9
KIT BASIN OR (CUSTOM PROCEDURE TRAY) ×3 IMPLANT
KIT ROOM TURNOVER OR (KITS) ×3 IMPLANT
LOOP VESSEL MAXI BLUE (MISCELLANEOUS) IMPLANT
MANIFOLD NEPTUNE II (INSTRUMENTS) ×3 IMPLANT
NEEDLE 22X1 1/2 (OR ONLY) (NEEDLE) IMPLANT
NS IRRIG 1000ML POUR BTL (IV SOLUTION) ×3 IMPLANT
PACK ORTHO EXTREMITY (CUSTOM PROCEDURE TRAY) ×3 IMPLANT
PAD ARMBOARD 7.5X6 YLW CONV (MISCELLANEOUS) ×6 IMPLANT
PAD CAST 3X4 CTTN HI CHSV (CAST SUPPLIES) ×1 IMPLANT
PAD CAST 4YDX4 CTTN HI CHSV (CAST SUPPLIES) ×1 IMPLANT
PADDING CAST COTTON 3X4 STRL (CAST SUPPLIES) ×3
PADDING CAST COTTON 4X4 STRL (CAST SUPPLIES) ×3
PLATE DVR VOLAR NAR LCK R (Orthopedic Implant) ×2 IMPLANT
PUTTY DBM STAGRAFT PLUS 2CC (Putty) ×2 IMPLANT
SCREW MD 2.7MMX14MM STE (Screw) ×2 IMPLANT
SPLINT FIBERGLASS 3X35 (CAST SUPPLIES) ×2 IMPLANT
SPONGE GAUZE 4X4 12PLY (GAUZE/BANDAGES/DRESSINGS) ×3 IMPLANT
SPONGE GAUZE 4X4 12PLY STER LF (GAUZE/BANDAGES/DRESSINGS) ×2 IMPLANT
SPONGE LAP 4X18 X RAY DECT (DISPOSABLE) IMPLANT
SUT MNCRL AB 4-0 PS2 18 (SUTURE) ×3 IMPLANT
SUT PROLENE 3 0 PS 2 (SUTURE) IMPLANT
SUT VIC AB 3-0 FS2 27 (SUTURE) IMPLANT
SYR CONTROL 10ML LL (SYRINGE) IMPLANT
SYSTEM CHEST DRAIN TLS 7FR (DRAIN) IMPLANT
TOWEL OR 17X24 6PK STRL BLUE (TOWEL DISPOSABLE) ×3 IMPLANT
TOWEL OR 17X26 10 PK STRL BLUE (TOWEL DISPOSABLE) ×3 IMPLANT
TUBE CONNECTING 12'X1/4 (SUCTIONS) ×1
TUBE CONNECTING 12X1/4 (SUCTIONS) ×2 IMPLANT
TUBE EVACUATION TLS (MISCELLANEOUS) ×3 IMPLANT
WATER STERILE IRR 1000ML POUR (IV SOLUTION) ×3 IMPLANT

## 2013-09-03 NOTE — Anesthesia Procedure Notes (Addendum)
Anesthesia Regional Block:  Supraclavicular block  Pre-Anesthetic Checklist: ,, timeout performed, Correct Patient, Correct Site, Correct Laterality, Correct Procedure, Correct Position, site marked, Risks and benefits discussed,  Surgical consent,  Pre-op evaluation,  At surgeon's request and post-op pain management  Laterality: Upper and Right  Prep: chloraprep       Needles:  Injection technique: Single-shot  Needle Type: Echogenic Needle          Additional Needles:  Procedures: ultrasound guided (picture in chart) Supraclavicular block Narrative:  Start time: 09/03/2013 1:27 PM End time: 09/03/2013 1:32 PM Injection made incrementally with aspirations every 5 mL.  Performed by: Personally  Anesthesiologist: Kyllian Clingerman  Additional Notes: H+P and labs reviewed, risks and benefits discussed with patient, procedure tolerated well without complications

## 2013-09-03 NOTE — H&P (Signed)
  Please see H&P from 09/02/2013  Patient presents for ORIF right distal radius fracture. Patient understands and accepts all risk and benefits of surgery.  Arm is been marked she is ready proceed I discussed all issues and care plans with she and her husband at bedside today.  We are planning surgery for your upper extremity. The risk and benefits of surgery include risk of bleeding infection anesthesia damage to normal structures and failure of the surgery to accomplish its intended goals of relieving symptoms and restoring function with this in mind we'll going to proceed. I have specifically discussed with the patient the pre-and postoperative regime and the does and don'ts and risk and benefits in great detail. Risk and benefits of surgery also include risk of dystrophy chronic nerve pain failure of the healing process to go onto completion and other inherent risks of surgery The relavent the pathophysiology of the disease/injury process, as well as the alternatives for treatment and postoperative course of action has been discussed in great detail with the patient who desires to proceed.  We will do everything in our power to help you (the patient) restore function to the upper extremity. Is a pleasure to see this patient today.   Joselynne Killam MD

## 2013-09-03 NOTE — Progress Notes (Signed)
Patient reports that she is starting to hurt. Provided blankets for elevation

## 2013-09-03 NOTE — Anesthesia Preprocedure Evaluation (Addendum)
Anesthesia Evaluation  Patient identified by MRN, date of birth, ID band Patient awake    Reviewed: Allergy & Precautions, H&P , NPO status , Patient's Chart, lab work & pertinent test results  History of Anesthesia Complications Negative for: history of anesthetic complications  Airway Mallampati: II TM Distance: >3 FB Neck ROM: Full    Dental  (+) Teeth Intact, Dental Advisory Given   Pulmonary neg pulmonary ROS,  breath sounds clear to auscultation        Cardiovascular hypertension, Pt. on medications - angina- Past MI, - CHF and - DOE - Valvular Problems/MurmursRhythm:Regular     Neuro/Psych negative neurological ROS     GI/Hepatic negative GI ROS, Neg liver ROS,   Endo/Other  diabetes, Type 2, Oral Hypoglycemic Agents  Renal/GU negative Renal ROS     Musculoskeletal  (+) Fibromyalgia -  Abdominal   Peds  Hematology negative hematology ROS (+)   Anesthesia Other Findings   Reproductive/Obstetrics                       Anesthesia Physical Anesthesia Plan  ASA: II  Anesthesia Plan: Regional and MAC   Post-op Pain Management:    Induction: Intravenous  Airway Management Planned: Natural Airway and Simple Face Mask  Additional Equipment: None  Intra-op Plan:   Post-operative Plan:   Informed Consent: I have reviewed the patients History and Physical, chart, labs and discussed the procedure including the risks, benefits and alternatives for the proposed anesthesia with the patient or authorized representative who has indicated his/her understanding and acceptance.   Dental advisory given  Plan Discussed with: CRNA and Surgeon  Anesthesia Plan Comments:         Anesthesia Quick Evaluation

## 2013-09-03 NOTE — Transfer of Care (Signed)
Immediate Anesthesia Transfer of Care Note  Patient: Cassie Martinez  Procedure(s) Performed: Procedure(s): OPEN REDUCTION INTERNAL FIXATION (ORIF) WRIST FRACTURE (Right)  Patient Location: PACU  Anesthesia Type:MAC combined with regional for post-op pain  Level of Consciousness: awake and alert   Airway & Oxygen Therapy: Patient Spontanous Breathing  Post-op Assessment: Report given to PACU RN and Post -op Vital signs reviewed and stable  Post vital signs: Reviewed and stable  Complications: No apparent anesthesia complications

## 2013-09-03 NOTE — Op Note (Signed)
See dictation # [id number] Ireene Ballowe MD

## 2013-09-03 NOTE — Anesthesia Postprocedure Evaluation (Signed)
  Anesthesia Post-op Note  Patient: Cassie KingfisherCharlene Branscomb  Procedure(s) Performed: Procedure(s): OPEN REDUCTION INTERNAL FIXATION (ORIF) WRIST FRACTURE (Right)  Patient Location: PACU  Anesthesia Type:MAC and Regional  Level of Consciousness: awake and alert   Airway and Oxygen Therapy: Patient Spontanous Breathing  Post-op Pain: none  Post-op Assessment: Post-op Vital signs reviewed, Patient's Cardiovascular Status Stable, Respiratory Function Stable, Patent Airway, No signs of Nausea or vomiting and Pain level controlled  Post-op Vital Signs: Reviewed and stable  Last Vitals:  Filed Vitals:   09/03/13 1722  BP: 143/75  Pulse: 78  Temp: 36.6 C  Resp: 16    Complications: No apparent anesthesia complications

## 2013-09-03 NOTE — Progress Notes (Signed)
Right interscalene block performed after timeout performed. Patient monitored NSR 70- 80's, SpO2 100%, RR 16- 18. No sedation given during block.

## 2013-09-03 NOTE — Progress Notes (Signed)
Per Dr. Maple HudsonMoser no preop lab, EKG, CXR needed.

## 2013-09-03 NOTE — ED Provider Notes (Signed)
Seen by Dr Amanda PeaGramig and discharged  [facility] Rubin PayorPickering, MD 09/03/13 1500

## 2013-09-03 NOTE — Op Note (Signed)
NAMDemetrios Loll:  Przybylski, Mairim            ACCOUNT NO.:  [id number]  MEDICAL RECORD NO.:  [id number]  LOCATION:  [id number]                        FACILITY:  [facility]  PHYSICIAN:  Dionne AnoWilliam M. Latavion Halls, M.D.DATE OF BIRTH:  06-10-1950  DATE OF PROCEDURE: DATE OF DISCHARGE:                              OPERATIVE REPORT   PREOPERATIVE DIAGNOSIS:  Comminuted complex volar Barton's fracture, right distal radius.  POSTOPERATIVE DIAGNOSIS:  Comminuted complex volar Barton's fracture, right distal radius.  PROCEDURE: 1. Open reduction, internal fixation, volar Barton's fracture     comminuted complex intra-articular distal radius fracture,     utilizing an extended volar rim plate from [employer]/DVR plate and     utilizing 2 mL of allograft bone graft. 2. Brachioradialis tenotomy. 3. Closed treatment of ulnar styloid fracture. 4. AP, lateral, and oblique x-rays reviewed and interpreted by myself.  SURGEON:  Dionne AnoWilliam M. Amanda PeaGramig, M.D.  ASSISTANT:  Karie ChimeraBrian Buchanan, P.A.-C.  COMPLICATIONS:  None.  ANESTHESIA:  Nerve block with IV sedation.  TOURNIQUET TIME:  Less than an hour.  DRAINS:  One.  ESTIMATED BLOOD LOSS:  Minimal.  INDICATIONS:  This patient is a 63 year old female who presents with the above-mentioned diagnosis.  I have counseled her in regard to risks and benefits of surgery and she desires to proceed with the above-mentioned operative intervention.  All questions have been encouraged, answered, and addressed.  OPERATIVE PROCEDURE:  The patient was seen by myself and Anesthesia, taken to operative suite, underwent IV sedation as an excellent block had been performed previously.  This was done by the anesthesia department of course.  Following this, the operation commenced with a sterile prep and drape with Betadine scrub and paint about the right upper extremity.  Outline marks were then made, sterile field secured, time-out called, and preoperative antibiotics were given in the form of  Ancef.  Once this was complete, tourniquet was elevated after arm was elevated and had a volar radial incision was made.  Dissection was carried down.  FCR tendon sheath was incised dorsally and palmarly.  Carpal canal contents were retracted ulnarly.  Pronator was then incised and reflected radial to ulna exposing the fracture site.  I then very carefully performed a sliding brachioradialis tenotomy followed by fracture reduction of the intra-articular portion.  This was done with Therapist, nutritionalreer elevator.  I then placed 2 mL of bone graft in the void.  This was done on the radial aspect of the fracture.  Following this, I then chose a narrow extended plate from the [employer] DVR set and applied this in standard fashion.  I was able to achieve excellent radial height inclination, volar tilt to my satisfaction demonstrated excellent capabilities of the midcarpal and radiocarpal joints under live fluoro.  Thus, ORIF comminuted complex intra-articular greater than 3 part intra- articular fracture was accomplished with an extended volar rim plate of a narrow variety.  Adequate radial height inclination, volar tilt was obtained.  I performed and reviewed the x-rays AP, lateral, and multiple obliques which looked excellent.  This was greater than 4-view x-ray series.  The patient looked well on x-ray.  I was pleased this in the findings.  Following this under live fluoro, identified the distal ulna.  The distal ulna was stable.  There were no complicating features.  She had ulna styloid fracture but no gross distal radioulnar joint insufficiency.  With this noted, we then very carefully and cautiously irrigated with greater than 2 L of saline followed by closure of the pronator with Vicryl, and placement of a TLS drain followed by closure of the skin edge with Prolene.  She tolerated this well.  Thumb spica splint was placed.  She will be admitted for IV antibiotics, general postop observation, and  other measures.  We will monitor closely.  We will see her back in 12-14 days for postop followup.  We will make sure things are going very smoothly for.  These notes have been discussed and all questions have been encouraged and answered.  This was an uncomplicated volar Barton's ORIF.  She was very comminuted. We will plan for standard postop algorithm for this nice lady.     Dionne AnoWilliam M. Amanda PeaGramig, M.D.     [facility]/MEDQ  D:  09/03/2013  T:  09/03/2013  Job:  [id number]

## 2013-09-04 DIAGNOSIS — S52509A Unspecified fracture of the lower end of unspecified radius, initial encounter for closed fracture: Secondary | ICD-10-CM | POA: Diagnosis not present

## 2013-09-04 DIAGNOSIS — S52609A Unspecified fracture of lower end of unspecified ulna, initial encounter for closed fracture: Secondary | ICD-10-CM | POA: Diagnosis not present

## 2013-09-04 LAB — GLUCOSE, CAPILLARY
GLUCOSE-CAPILLARY: 142 mg/dL — AB (ref 70–99)
GLUCOSE-CAPILLARY: 121 mg/dL — AB (ref 70–99)

## 2013-09-04 MED ORDER — OXYCODONE HCL 5 MG PO TABS: 5.0000 mg | ORAL_TABLET | ORAL | Status: AC | PRN

## 2013-09-04 MED ORDER — MORPHINE SULFATE 2 MG/ML IJ SOLN
1.0000 mg | INTRAMUSCULAR | Status: DC | PRN
  Administered 2013-09-04: 4 mg via INTRAVENOUS
  Administered 2013-09-04: 2 mg via INTRAVENOUS
  Filled 2013-09-04 (×2): qty 2

## 2013-09-04 MED ORDER — CEPHALEXIN 500 MG PO CAPS
1000.0000 mg | ORAL_CAPSULE | Freq: Four times a day (QID) | ORAL | Status: DC
  Administered 2013-09-04: 1000 mg via ORAL
  Filled 2013-09-04 (×4): qty 2

## 2013-09-04 NOTE — Discharge Summary (Signed)
Physician Discharge Summary  Patient ID: Amada KingfisherCharlene Alter MRN: [id number] DOB/AGE: April 06, 1951 63 y.o.  Admit date: 09/03/2013 Discharge date: 09/04/2013  Admission Diagnoses: Status post ORIF right wrist fracture 09/03/2013 secondary to fall with resultant displaced distal radius fracture  Discharge Diagnoses: Same Active Problems:   Distal radius fracture, right   Discharged Condition: good  Hospital Course: Patient was admitted postop to receive antibiotics observation and other measures status post ORIF right wrist fracture.  Postop day 1 she was doing well ready for discharge and had no problems. She had no signs of DVT infection or other problems. She is moving her fingers well sensate and without issues. Drains been removed she looks quite well and is ready for discharge. We will see her back in the office in 12-14 days.  Consults: None  Significant Diagnostic Studies: labs: See  Treatments: surgery: See op note  Discharge Exam: Blood pressure 136/54, pulse 72, temperature 98.6 F (37 C), temperature source Oral, resp. rate 16, height 5\' 6"  (1.676 m), weight 92.987 kg (205 lb), SpO2 99.00%. General appearance: alert, cooperative and appears stated age patient is alert and oriented. Drain is removed. Fingers move nicely. She is sensate. There is no complicating The patient is alert and oriented in no acute distress the patient complains of pain in the affected upper extremity.  The patient is noted to have a normal HEENT exam.  Lung fields show equal chest expansion and no shortness of breath  abdomen exam is nontender without distention.  Lower extremity examination does not show any fracture dislocation or blood clot symptoms.  Pelvis is stable neck and back are stable and nontender features.  Disposition: 01-Home or Self Care     Medication List    ASK your doctor about these medications       BIOTIN PO  Take 1 tablet by mouth 2 (two) times daily.     calcium  carbonate 600 MG Tabs tablet  Commonly known as:  OS-CAL  Take 600 mg by mouth 2 (two) times daily.     fosinopril 20 MG tablet  Commonly known as:  MONOPRIL  Take 20 mg by mouth daily.     glimepiride 4 MG tablet  Commonly known as:  AMARYL  Take 4 mg by mouth daily with breakfast.     ibuprofen 800 MG tablet  Commonly known as:  ADVIL,MOTRIN  Take 800 mg by mouth 3 (three) times daily.     lovastatin 40 MG tablet  Commonly known as:  MEVACOR  Take 40 mg by mouth daily.     methocarbamol 750 MG tablet  Commonly known as:  ROBAXIN-750  Take 1 tablet (750 mg total) by mouth 4 (four) times daily.     omeprazole 40 MG capsule  Commonly known as:  PRILOSEC  Take 40 mg by mouth daily.     oxycodone 5 MG capsule  Commonly known as:  OXY-IR  Take 2 capsules (10 mg total) by mouth every 4 (four) hours as needed.     traMADol 50 MG tablet  Commonly known as:  ULTRAM  Take 100 mg by mouth 3 (three) times daily.     triamterene-hydrochlorothiazide 75-50 MG per tablet  Commonly known as:  MAXZIDE  Take 1 tablet by mouth daily.     Vitamin D3 50000 UNITS Caps  Take 1 capsule by mouth once a week. Takes on Saturdays           Follow-up Information   [facility]  Higinio PlanIII,Eeshan Verbrugge M, MD In 12 days. (I'll office will call so that you can see us in 12 days to 14 days)    Specialty:  Orthopedic Surgery   Contact information:   [address] [place] [address] [phone]       Signed: Dominica SeverinWilliam Henya Aguallo 09/04/2013, 11:52 AM

## 2013-09-04 NOTE — Progress Notes (Signed)
Utilization Review Completed.  

## 2013-09-04 NOTE — Discharge Instructions (Signed)
Please keep your bandage clean and dry  Please call for any problems. Please move your fingers frequently. Please move your shoulder occasionally so as not to let it get stiff  We recommend that you to take vitamin C 1000 mg a day to promote healing we also recommend that if you require her pain medicine that he take a stool softener to prevent constipation as most pain medicines will have constipation side effects. We recommend either Peri-Colace or Senokot and recommend that you also consider adding MiraLAX to prevent the constipation affects from pain medicine if you are required to use them. These medicines are over the counter and maybe purchased at a local pharmacy.  Keep bandage clean and dry.  Call for any problems.  No smoking.  Criteria for driving a car: you should be off your pain medicine for 7-8 hours, able to drive one handed(confident), thinking clearly and feeling able in your judgement to drive. Continue elevation as it will decrease swelling.  If instructed by MD move your fingers within the confines of the bandage/splint.  Use ice if instructed by your MD. Call immediately for any sudden loss of feeling in your hand/arm or change in functional abilities of the extremity.

## 2013-09-04 NOTE — Progress Notes (Signed)
Occupational Therapy Evaluation and Discharge Patient Details Name: Cassie KingfisherCharlene Martinez MRN: [id number] DOB: 1950-10-22 Today's Date: 09/04/2013    History of Present Illness Pt is 63 y.o Female s/p R ORIF for wrist fracture from a fall.   Clinical Impression   PTA pt lived at home and was independent with ADLs and functional mobility prior to her fall. Pt reports husband will be home to assist with ADLs as needed. Education and training completed for fall prevention, therapeutic ROM, and edema control. Also educated pt and husband on compensatory techniques for UB ADLs. Pt moving at Mod independent level and notified PT and RN of mobility status. No further acute OT needs.     Follow Up Recommendations  No OT follow up;Supervision/Assistance - 24 hour    Equipment Recommendations  None recommended by OT       Precautions / Restrictions Precautions Precautions: Fall Required Braces or Orthoses: Other Brace/Splint (splint/bandaging on RUE from distal palmar crease to mid for) Restrictions Weight Bearing Restrictions: No      Mobility Bed Mobility Overal bed mobility: Modified Independent                Transfers Overall transfer level: Modified independent Equipment used: None                  Balance Overall balance assessment: Modified Independent                                          ADL Overall ADL's : Needs assistance/impaired Eating/Feeding: Modified independent;Sitting   Grooming: Modified independent;Standing   Upper Body Bathing: Minimal assitance;Sitting   Lower Body Bathing: Modified independent;Sit to/from stand   Upper Body Dressing : Minimal assistance;Sitting   Lower Body Dressing: Modified independent;Sit to/from stand   Toilet Transfer: Modified Independent;Ambulation;Regular Toilet   Toileting- Clothing Manipulation and Hygiene: Modified independent;Sit to/from stand   Tub/ Shower Transfer: Modified  independent;Walk-in shower;Ambulation   Functional mobility during ADLs: Modified independent General ADL Comments: Pt with good mobility, however limited by involvement of dominant, RUE. Educated pt in therapeutic ROM for R shoulder, elbow, and fingers to maintain ROM and prevent stiffness. Educated pt and husband on elevation, ice, and movement for edema control. Educated pt and husband on compensatory techniques for UB ADLs with one hand and providing barrier with plastic bags for pt to be able to shower. Pt has no further OT needs.      Vision  Per pt report, no change from baseline.                   Perception Perception Perception Tested?: No   Praxis Praxis Praxis tested?: Within functional limits    Pertinent Vitals/Pain 4/10 in R distal radius, repositioned and applied ice.      Hand Dominance Right   Extremity/Trunk Assessment Upper Extremity Assessment Upper Extremity Assessment: RUE deficits/detail RUE Deficits / Details: Dominant, RUE immobilized in splint/bandaging from distal palmar crease to mid forearm. Thumb IP free RUE: Unable to fully assess due to pain;Unable to fully assess due to immobilization RUE Coordination: decreased fine motor   Lower Extremity Assessment Lower Extremity Assessment: Overall WFL for tasks assessed   Cervical / Trunk Assessment Cervical / Trunk Assessment: Normal   Communication Communication Communication: No difficulties   Cognition Arousal/Alertness: Awake/alert Behavior During Therapy: WFL for tasks assessed/performed Overall Cognitive Status: Within Functional Limits for tasks  assessed                        Exercises Exercises: Other exercises Other Exercises Other Exercises: Composite digit flexion/extension, elbow flex/ext, and shoulder flex/ext of RUE x 10. Encouraged pt to perform throughout the day.        Home Living Family/patient expects to be discharged to:: Private residence Living  Arrangements: Spouse/significant other Available Help at Discharge: Family;Available 24 hours/day Type of Home: House Home Access: Stairs to enter     Home Layout: One level     Bathroom Shower/Tub: Producer, television/film/videoWalk-in shower   Bathroom Toilet: Standard     Home Equipment: None          Prior Functioning/Environment Level of Independence: Independent                                       End of Session Nurse Communication: Other (comment) (no further OT needs)  Activity Tolerance: Patient tolerated treatment well Patient left: in bed;with call bell/phone within reach;with family/visitor present   Time: 1213-1223 OT Time Calculation (min): 10 min Charges:  OT General Charges $OT Visit: 1 Procedure OT Evaluation $Initial OT Evaluation Tier I: 1 Procedure G-Codes: OT G-codes **NOT FOR INPATIENT CLASS** Functional Assessment Tool Used: clinical judgment Functional Limitation: Self care Self Care Current Status ([id number](G8987): At least 1 percent but less than 20 percent impaired, limited or restricted Self Care Goal Status (U0454(G8988): At least 1 percent but less than 20 percent impaired, limited or restricted Self Care Discharge Status [phone]): At least 1 percent but less than 20 percent impaired, limited or restricted   Cassie Martinez [phone] 09/04/2013, 1:23 PM

## 2013-09-04 NOTE — Progress Notes (Signed)
Physical Therapy Note  Spoke with occupational therapy after initial evaluation. OT reports patient is functioning at a high level of independence and no physical therapy is indicated at this time. PT is signing-off. Please re-order if there is any significant change in status. Thank you for this referral.  Arjuna Doeden Secor Genean Adamski, PT [phone]    

## 2013-09-04 NOTE — Progress Notes (Signed)
Written and verbal d/c instructions given to patient and family, analgesic prescription received by patient. l hand iv site out, cathalon intact. Pt and family deny questions

## 2013-09-04 NOTE — Progress Notes (Signed)
Dr Amanda PeaGramig aware of iv infiltrate and inability to give last iv ancef dose.

## 2013-09-06 ENCOUNTER — Encounter (HOSPITAL_COMMUNITY): Payer: Self-pay | Admitting: Orthopedic Surgery

## 2013-09-23 ENCOUNTER — Encounter (HOSPITAL_COMMUNITY): Payer: Self-pay | Admitting: Orthopedic Surgery

## 2013-09-23 NOTE — OR Nursing (Signed)
Late entry on 09-23-2013 by D. Claudio Mondry, RN to add the procedure end time.  

## 2015-10-07 IMAGING — CR DG HAND COMPLETE 3+V*R*
3 series · 3 of 3 positions shown · non-contrast
Comparison: None.

CLINICAL DATA: Pain post trauma

EXAM:
RIGHT HAND - COMPLETE 3+ VIEW

[x hand lat right]
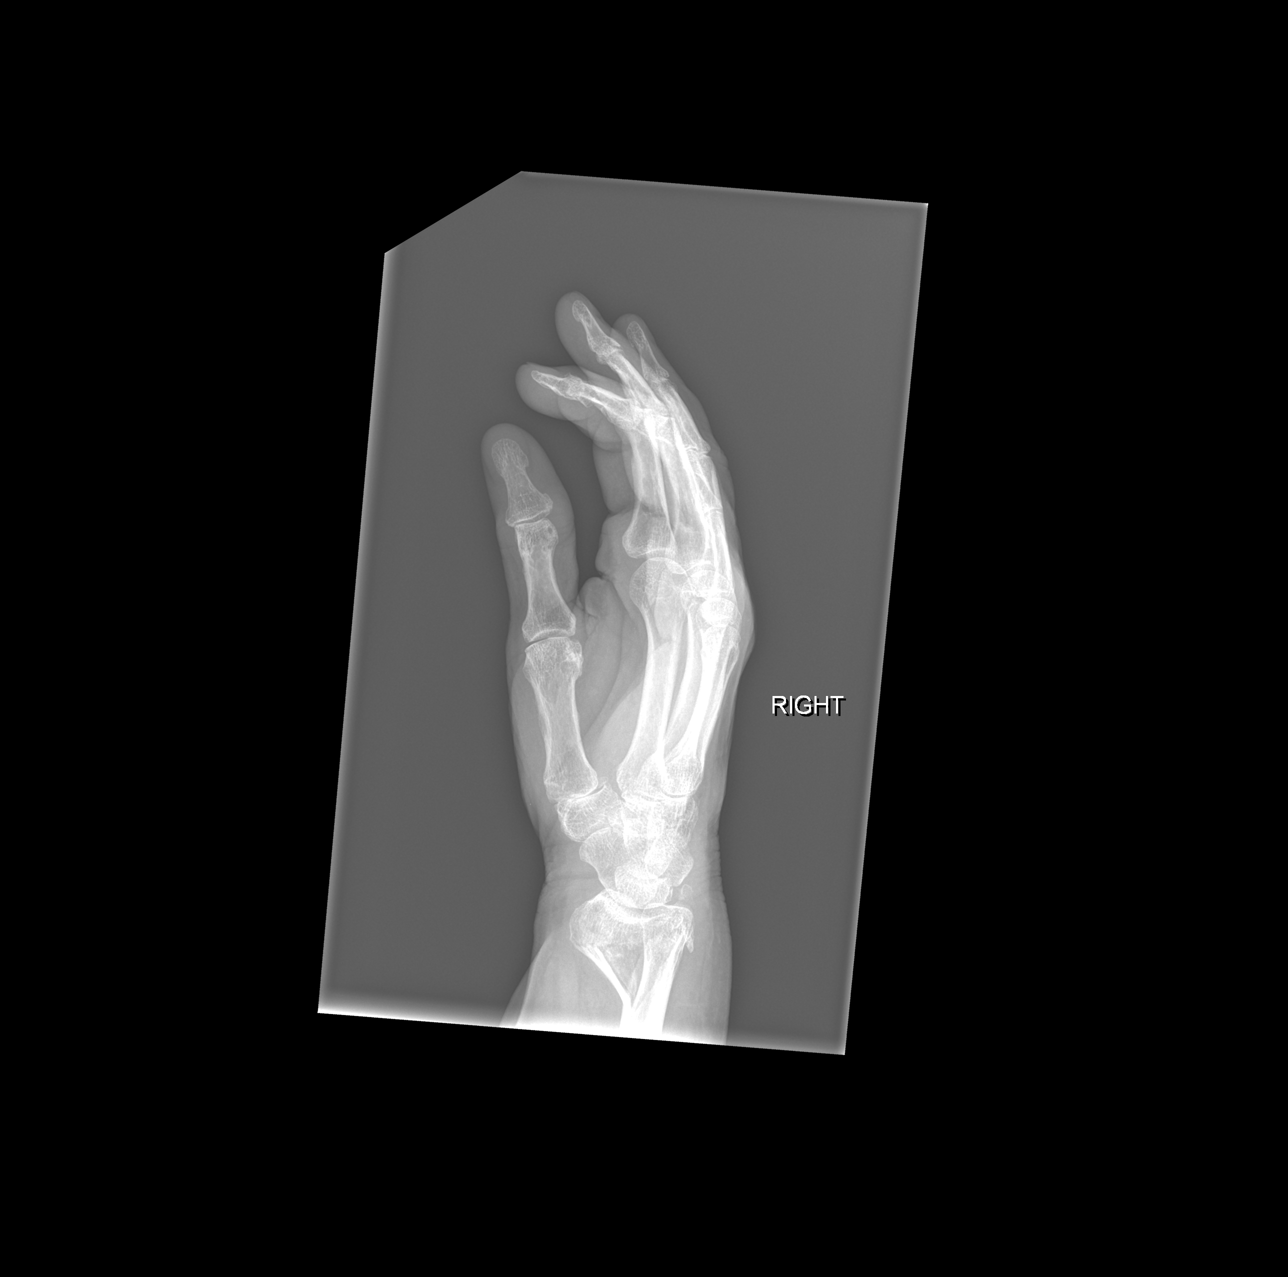

[x hand obl right]
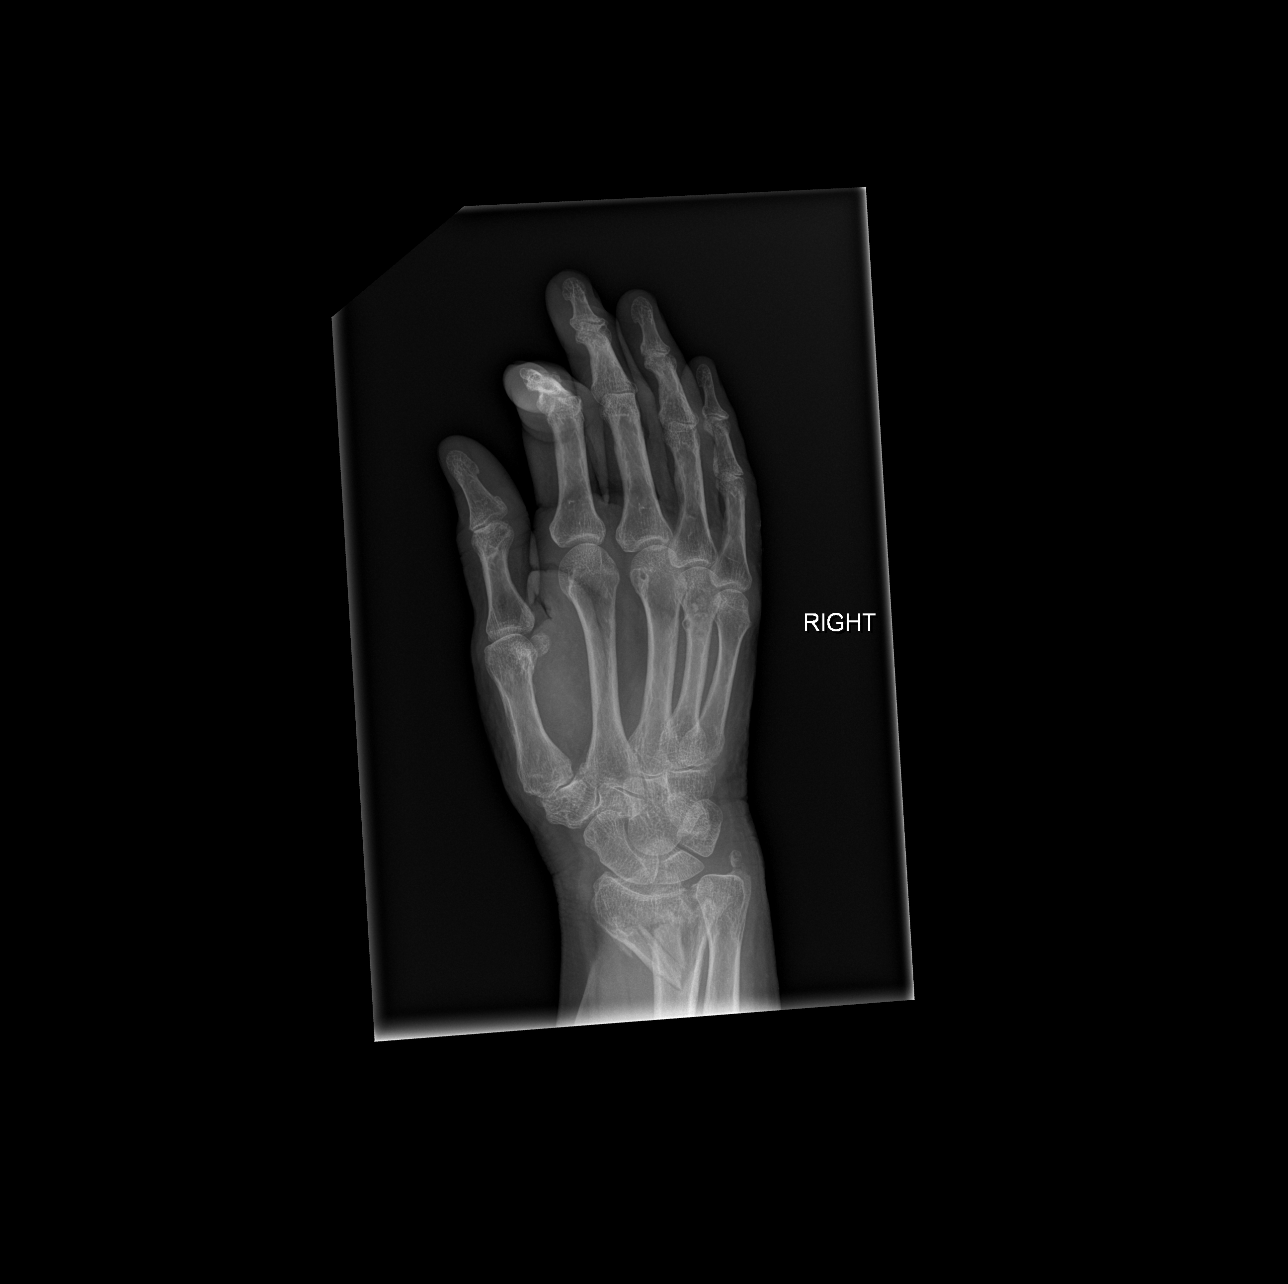

[x hand pa right]
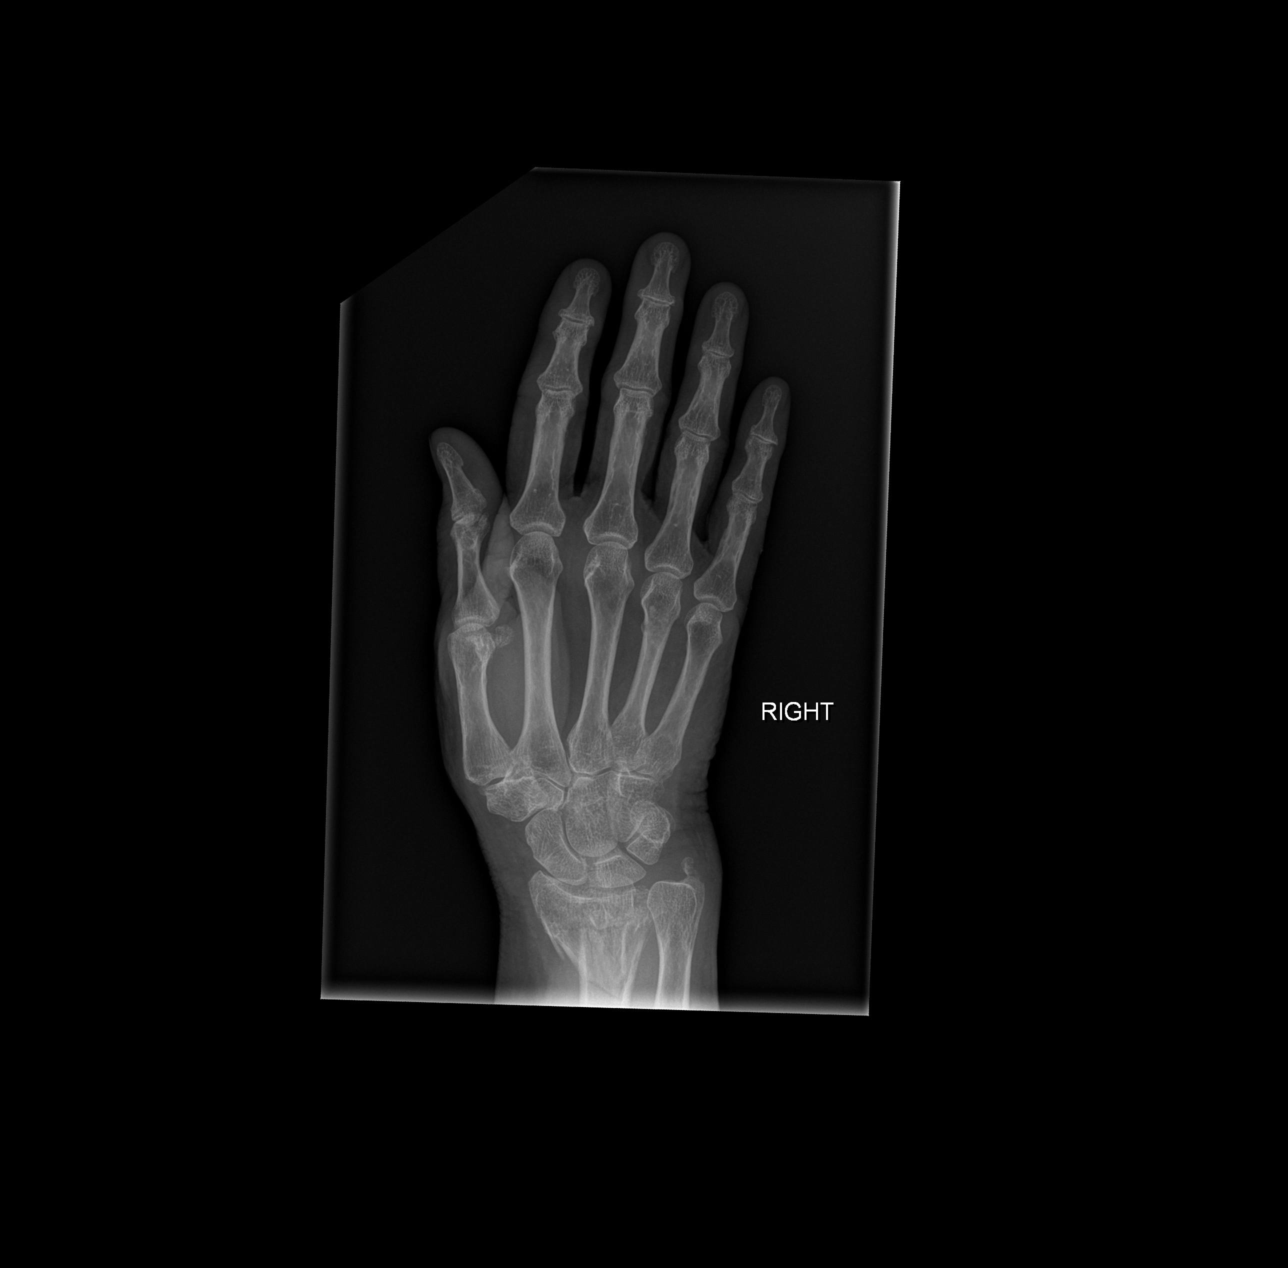

[3 of 3 positions shown; findings below may reference images not displayed]

FINDINGS: Frontal, oblique, and lateral views were obtained. There is a
comminuted fracture of the distal radial metaphysis. There is
avulsion of the ulnar styloid. It the hand region, there is no
appreciable fracture or dislocation. There is mild to moderate
narrowing of all PIP and DIP joints. There is osteoarthritic change
in the scaphotrapezial and saddle joints.
IMPRESSION: Fractures of the distal radius and ulna. Multilevel osteoarthritic
change.

## 2017-09-02 ENCOUNTER — Encounter: Payer: Self-pay | Admitting: Physical Therapy

## 2017-09-02 ENCOUNTER — Ambulatory Visit: Payer: Medicare Other | Attending: Neurological Surgery | Admitting: Physical Therapy

## 2017-09-02 ENCOUNTER — Other Ambulatory Visit: Payer: Self-pay

## 2017-09-02 DIAGNOSIS — R293 Abnormal posture: Secondary | ICD-10-CM | POA: Diagnosis present

## 2017-09-02 DIAGNOSIS — M5441 Lumbago with sciatica, right side: Secondary | ICD-10-CM | POA: Diagnosis present

## 2017-09-02 NOTE — Therapy (Signed)
[facility] [address] [place], [place], [address] Phone: [phone]   Fax:  [phone]  Physical Therapy Evaluation  Patient Details  Name: Cassie Martinez MRN: [id number] Date of Birth: 08-11-1950 Referring Provider: Marikay Alar MD   Encounter Date: 09/02/2017  PT End of Session - 09/02/17 1052    Visit Number  1    Number of Visits  16    Date for PT Re-Evaluation  12/01/17    Authorization Type  FOTO AT LEAST EVERY 5TH VISIT, 10TH VISIT PROGRESS NOTE AND KX MODIFIER AFTER THE 15 VISIT.    PT Start Time  0908    PT Stop Time  1006    PT Time Calculation (min)  58 min    Activity Tolerance  Patient tolerated treatment well    Behavior During Therapy  WFL for tasks assessed/performed       Past Medical History:  Diagnosis Date  . Arthritis   . Diabetes mellitus without complication (HCC)   . Fibromyalgia   . History of melanoma   . Hypertension     Past Surgical History:  Procedure Laterality Date  . CHOLECYSTECTOMY    . MELANOMA EXCISION    . ORIF WRIST FRACTURE Right 09/03/2013   Procedure: OPEN REDUCTION INTERNAL FIXATION (ORIF) WRIST FRACTURE;  Surgeon: Dominica Severin, MD;  Location: [facility] OR;  Service: Orthopedics;  Laterality: Right;  . SKIN CANCER EXCISION     on eye lid    There were no vitals filed for this visit.   Subjective Assessment - 09/02/17 1013    Subjective  The patient reports a history of low back pain but she has been doing well until three weeks ago when she reached down from her bed to pick up her 15# dog and then twisted her spine to put the dog on the bed.  She began to experience right sided low back that then began to go into her right buttock, calf and foot.  her resting pain-level today is a 4/10 but increases with increased activity.  Rest decrease pain.    Pertinent History  Arthrits; DM.    Diagnostic tests  CT scan.    Currently in Pain?  Yes    Pain Score  4     Pain Location  Back     Pain Orientation  Right    Pain Descriptors / Indicators  Aching;Throbbing    Pain Type  Acute pain    Pain Radiating Towards  Right lower extremity.    Pain Onset  1 to 4 weeks ago    Pain Frequency  Constant    Aggravating Factors   See above.    Pain Relieving Factors  See above.         [facility] PT Assessment - 09/02/17 0001      Assessment   Medical Diagnosis  Spondylolithesis, Lumbar region.    Referring Provider  Marikay Alar MD    Onset Date/Surgical Date  -- 3 weeks.      Precautions   Precautions  None      Restrictions   Weight Bearing Restrictions  No      Balance Screen   Has the patient fallen in the past 6 months  No    Has the patient had a decrease in activity level because of a fear of falling?   No    Is the patient reluctant to leave their home because of a fear of falling?   No  Home Environment   Living Environment  Private residence      Prior Function   Level of Independence  Independent      Posture/Postural Control   Posture/Postural Control  Postural limitations    Postural Limitations  Rounded Shoulders;Forward head;Increased thoracic kyphosis;Flexed trunk    Posture Comments  Left convexity lower thoracic and convexity on right in lumbar region.      ROM / Strength   AROM / PROM / Strength  AROM;Strength      AROM   Overall AROM Comments  Full lumbar flexion and extension= 20 degrees.  Bilateral knees lack full extension due to arthritis.      Strength   Overall Strength Comments  Right ankle dorsiflexion= 4/5.      Palpation   Palpation comment  Tender to palpation over right SIJ and to a lesser degree over her right QL.      Special Tests   Other special tests  Bilateral Patellar reflexes 1+/4+ and absent Achilles DTR's; (=) leg lengths; (-) SLR; (+) right FABER test.      Bed Mobility   Bed Mobility  -- Independent.      Ambulation/Gait   Gait Comments  The patient walks in some trunk flexion.                 Objective measurements completed on examination: See above findings.      [facility] Adult PT Treatment/Exercise - 09/02/17 0001      Modalities   Modalities  Electrical Stimulation;Moist Heat      Moist Heat Therapy   Number Minutes Moist Heat  20 Minutes    Moist Heat Location  Lumbar Spine      Electrical Stimulation   Electrical Stimulation Location  Right SIJ.    Electrical Stimulation Action  Pre-mod.    Electrical Stimulation Parameters  80-150 Hz x 20 minutes.    Electrical Stimulation Goals  Pain             PT Education - 09/02/17 1016    Education provided  Yes    Education Details  Instruct in correct posture, draw-ins and sleeping position with pillows.    Person(s) Educated  Patient    Methods  Explanation;Demonstration    Comprehension  Verbalized understanding;Returned demonstration;Need further instruction       PT Short Term Goals - 09/02/17 1214      PT SHORT TERM GOAL #1   Title  STG's=LTG's.        PT Long Term Goals - 09/02/17 1214      PT LONG TERM GOAL #1   Title  Independent with a HEP.    Time  8    Period  Weeks    Status  New      PT LONG TERM GOAL #2   Title  Perform ADL's with pain not > 2-3/10.    Time  8    Period  Weeks    Status  New      PT LONG TERM GOAL #3   Title  Eliminate right LE symptoms.    Time  8    Period  Weeks    Status  New             Plan - 09/02/17 1206    Clinical Impression Statement  The patient presents to OPPT with a severe exacerbation of right sided low back pain and radiation of pain into her right LE.  She is c/o calf  pain today. She is tender to palpation over her right SIJ.  Her right dorsiflexion strength is decreased.  Patient expected to benefit from skilled PT intervention.    History and Personal Factors relevant to plan of care:  Arthrits; DM; Fibromyalgia.    Clinical Presentation  Stable    Clinical Decision Making  Low    Rehab Potential  Excellent     PT Frequency  2x / week    PT Duration  8 weeks    PT Treatment/Interventions  ADLs/Self Care Home Management;Cryotherapy;Electrical Stimulation;Moist Heat;Ultrasound;Therapeutic activities;Therapeutic exercise;Patient/family education;Manual techniques;Dry needling    PT Next Visit Plan  Please ask patient to get a copy of her CT scan.  Combo e'stim/U/S to right SIJ; STW/M; positional traction; draw-in progression; SKTC and hip bridges.    Consulted and Agree with Plan of Care  Patient       Patient will benefit from skilled therapeutic intervention in order to improve the following deficits and impairments:  Decreased activity tolerance, Pain, Postural dysfunction, Decreased strength  Visit Diagnosis: Abnormal posture - Plan: PT plan of care cert/re-cert  Acute right-sided low back pain with right-sided sciatica - Plan: PT plan of care cert/re-cert     Problem List Patient Active Problem List   Diagnosis Date Noted  . Distal radius fracture, right 09/02/2013  . Fracture of right ulnar styloid 09/02/2013    Apolinar Bero, [place] MPT 09/02/2017, 12:28 PM  [facility] [address] [place], [place], [address] Phone: [phone]   Fax:  [phone]  Name: Cassie Martinez MRN: [id number] Date of Birth: 05-Oct-1950

## 2017-09-07 ENCOUNTER — Encounter: Payer: Self-pay | Admitting: Physical Therapy

## 2017-09-07 ENCOUNTER — Ambulatory Visit: Payer: Medicare Other | Admitting: Physical Therapy

## 2017-09-07 DIAGNOSIS — R293 Abnormal posture: Secondary | ICD-10-CM

## 2017-09-07 DIAGNOSIS — M5441 Lumbago with sciatica, right side: Secondary | ICD-10-CM

## 2017-09-07 NOTE — Therapy (Signed)
[facility] [address] [place], [place], [address] Phone: [phone]   Fax:  [phone]  Physical Therapy Treatment  Patient Details  Name: Cassie Martinez MRN: [id number] Date of Birth: 12/16/1950 Referring Provider: Marikay Alar MD   Encounter Date: 09/07/2017  PT End of Session - 09/07/17 0939    Visit Number  2    Number of Visits  16    Date for PT Re-Evaluation  12/01/17    Authorization Type  FOTO AT LEAST EVERY 5TH VISIT, 10TH VISIT PROGRESS NOTE AND KX MODIFIER AFTER THE 15 VISIT.    PT Start Time  0901    PT Stop Time  1000    PT Time Calculation (min)  59 min    Activity Tolerance  Patient tolerated treatment well    Behavior During Therapy  WFL for tasks assessed/performed       Past Medical History:  Diagnosis Date  . Arthritis   . Diabetes mellitus without complication (HCC)   . Fibromyalgia   . History of melanoma   . Hypertension     Past Surgical History:  Procedure Laterality Date  . CHOLECYSTECTOMY    . MELANOMA EXCISION    . ORIF WRIST FRACTURE Right 09/03/2013   Procedure: OPEN REDUCTION INTERNAL FIXATION (ORIF) WRIST FRACTURE;  Surgeon: Dominica Severin, MD;  Location: [facility] OR;  Service: Orthopedics;  Laterality: Right;  . SKIN CANCER EXCISION     on eye lid    There were no vitals filed for this visit.  Subjective Assessment - 09/07/17 0901    Subjective  Patient reported some decreased discomfort yet at rest today    Pertinent History  Arthrits; DM.    Diagnostic tests  CT scan.    Currently in Pain?  Yes    Pain Score  3     Pain Location  Back    Pain Orientation  Right    Pain Descriptors / Indicators  Aching;Discomfort    Pain Type  Acute pain    Pain Radiating Towards  right LE    Pain Onset  1 to 4 weeks ago    Pain Frequency  Constant    Aggravating Factors   walking or increased activity    Pain Relieving Factors  rest                       [facility] Adult PT  Treatment/Exercise - 09/07/17 0001      Self-Care   Self-Care  ADL's;Lifting;Posture;Other Self-Care Comments    Other Self-Care Comments   HEP given for all the above      Exercises   Exercises  Knee/Hip;Lumbar      Lumbar Exercises: Supine   Ab Set  20 reps;3 seconds    Glut Set  20 reps;3 seconds    Bent Knee Raise  3 seconds;Other (comment) 2x10    Bridge  20 reps;3 seconds    Bridge Limitations  .    Straight Leg Raise  3 seconds 2x10      Knee/Hip Exercises: Sidelying   Other Sidelying Knee/Hip Exercises  positional traction x85min      Modalities   Modalities  Electrical Stimulation;Moist Heat;Ultrasound      Moist Heat Therapy   Number Minutes Moist Heat  15 Minutes    Moist Heat Location  Lumbar Spine      Electrical Stimulation   Electrical Stimulation Location  Right SIJ.    Electrical Stimulation Action  premod  Electrical Stimulation Parameters  80-150hz  x66min    Electrical Stimulation Goals  Pain      Ultrasound   Ultrasound Location  Right SIJ    Ultrasound Parameters  combo US/ES .5w/cm2/100%/74mhz x62min    Ultrasound Goals  Pain             PT Education - 09/07/17 0939    Education provided  Yes    Education Details  HEP    Person(s) Educated  Patient    Methods  Explanation;Demonstration;Handout    Comprehension  Verbalized understanding;Returned demonstration       PT Short Term Goals - 09/02/17 1214      PT SHORT TERM GOAL #1   Title  STG's=LTG's.        PT Long Term Goals - 09/07/17 0943      PT LONG TERM GOAL #1   Title  Independent with a HEP.    Time  8    Period  Weeks    Status  On-going      PT LONG TERM GOAL #2   Title  Perform ADL's with pain not > 2-3/10.    Time  8    Period  Weeks    Status  On-going      PT LONG TERM GOAL #3   Title  Eliminate right LE symptoms.    Time  8    Period  Weeks    Status  On-going            Plan - 09/07/17 0944    Clinical Impression Statement  Patient  tolerated treatment well today. Patient was educated on posture awareness techniques and core activation and progression with HEP provided. Patient able to complete all exercises today with good understanding of progression. Patient has ongoing discomfort in right LE. Patient did well with positional traction. Goals ongoing at this time.     Rehab Potential  Excellent    PT Frequency  2x / week    PT Duration  8 weeks    PT Treatment/Interventions  ADLs/Self Care Home Management;Cryotherapy;Electrical Stimulation;Moist Heat;Ultrasound;Therapeutic activities;Therapeutic exercise;Patient/family education;Manual techniques;Dry needling    PT Next Visit Plan  Please ask patient to get a copy of her CT scan.  Combo e'stim/U/S to right SIJ; STW/M; positional traction; draw-in progression; SKTC and hip bridges.    Consulted and Agree with Plan of Care  Patient       Patient will benefit from skilled therapeutic intervention in order to improve the following deficits and impairments:  Decreased activity tolerance, Pain, Postural dysfunction, Decreased strength  Visit Diagnosis: Abnormal posture  Acute right-sided low back pain with right-sided sciatica     Problem List Patient Active Problem List   Diagnosis Date Noted  . Distal radius fracture, right 09/02/2013  . Fracture of right ulnar styloid 09/02/2013    Kam Kushnir P, PTA 09/07/2017, 10:03 AM  [facility] [address] [place], [place], [address] Phone: [phone]   Fax:  [phone]  Name: Cassie Martinez MRN: [id number] Date of Birth: 17-Sep-1950

## 2017-09-07 NOTE — Patient Instructions (Signed)
Pelvic Tilt: Posterior - Legs Bent (Supine)  Tighten stomach and flatten back by rolling pelvis down. Hold _10___ seconds. Relax. Repeat _10-30___ times per set. Do __2__ sets per session. Do _2___ sessions per day.   Bent Leg Lift (Hook-Lying)  Tighten stomach and slowly raise right leg _5___ inches from floor. Keep trunk rigid. Hold _3___ seconds. Repeat _10___ times per set. Do ___2-3_ sets per session. Do __2__ sessions per day.   Straight Leg Raise  Tighten stomach and slowly raise locked right leg __4__ inches from floor. Repeat __10-30__ times per set. Do __2__ sets per session. Do __2__ sessions per day.  Bridging  Slowly raise buttocks from floor, keeping stomach tight. Repeat _10___ times per set. Do __2__ sets per session. Do __2__ sessions per day.   Brushing Teeth    Place one foot on ledge and one hand on counter. Bend other knee slightly to keep back straight.  Copyright  VHI. All rights reserved.  Refrigerator   Squat with knees apart to reach lower shelves and drawers.   Copyright  VHI. All rights reserved.  Laundry Basket   Squat down and hold basket close to stand. Use leg muscles to do the work.   Copyright  VHI. All rights reserved.  Housework - Vacuuming   Hold the vacuum with arm held at side. Step back and forth to move it, keeping head up. Avoid twisting.   Copyright  VHI. All rights reserved.  Housework - Wiping   Position yourself as close as possible to reach work surface. Avoid straining your back.   Copyright  VHI. All rights reserved.  Gardening - Mowing   Keep arms close to sides and walk with lawn mower.   Copyright  VHI. All rights reserved.  Sleeping on Side   Place pillow between knees. Use cervical support under neck and a roll around waist as needed.   Copyright  VHI. All rights reserved.  Log Roll   Lying on back, bend left knee and place left arm across chest. Roll all in one movement to the  right. Reverse to roll to the left. Always move as one unit.   Copyright  VHI. All rights reserved.  Stand to Sit / Sit to Stand   To sit: Bend knees to lower self onto front edge of chair, then scoot back on seat. To stand: Reverse sequence by placing one foot forward, and scoot to front of seat. Use rocking motion to stand up.  Copyright  VHI. All rights reserved.  Posture - Standing   Good posture is important. Avoid slouching and forward head thrust. Maintain curve in low back and align ears over shoul- ders, hips over ankles.   Copyright  VHI. All rights reserved.  Posture - Sitting   Sit upright, head facing forward. Try using a roll to support lower back. Keep shoulders relaxed, and avoid rounded back. Keep hips level with knees. Avoid crossing legs for long periods.   Copyright  VHI. All rights reserved.  Computer Work   Position work to face forward. Use proper work and seat height. Keep shoulders back and down, wrists straight, and elbows at right angles. Use chair that provides full back support. Add footrest and lumbar roll as needed.   Copyright  VHI. All rights reserved.   

## 2017-09-10 ENCOUNTER — Encounter: Payer: Self-pay | Admitting: Physical Therapy

## 2017-09-10 ENCOUNTER — Ambulatory Visit: Payer: Medicare Other | Admitting: Physical Therapy

## 2017-09-10 DIAGNOSIS — R293 Abnormal posture: Secondary | ICD-10-CM

## 2017-09-10 DIAGNOSIS — M5441 Lumbago with sciatica, right side: Secondary | ICD-10-CM

## 2017-09-10 NOTE — Therapy (Signed)
[facility] [address] [place], [place], [address] Phone: [phone]   Fax:  [phone]  Physical Therapy Treatment  Patient Details  Name: Cassie Martinez MRN: [id number] Date of Birth: 1950/11/20 Referring Provider: Marikay Alar MD   Encounter Date: 09/10/2017  PT End of Session - 09/10/17 1018    Visit Number  3    Number of Visits  16    Date for PT Re-Evaluation  12/01/17    Authorization Type  FOTO AT LEAST EVERY 5TH VISIT, 10TH VISIT PROGRESS NOTE AND KX MODIFIER AFTER THE 15 VISIT.    PT Start Time  0906    PT Stop Time  0959    PT Time Calculation (min)  53 min    Activity Tolerance  Patient tolerated treatment well    Behavior During Therapy  [facility] for tasks assessed/performed       Past Medical History:  Diagnosis Date  . Arthritis   . Diabetes mellitus without complication (HCC)   . Fibromyalgia   . History of melanoma   . Hypertension     Past Surgical History:  Procedure Laterality Date  . CHOLECYSTECTOMY    . MELANOMA EXCISION    . ORIF WRIST FRACTURE Right 09/03/2013   Procedure: OPEN REDUCTION INTERNAL FIXATION (ORIF) WRIST FRACTURE;  Surgeon: Dominica Severin, MD;  Location: [facility] OR;  Service: Orthopedics;  Laterality: Right;  . SKIN CANCER EXCISION     on eye lid    There were no vitals filed for this visit.  Subjective Assessment - 09/10/17 1018    Subjective  Those exercises Trula Ore showed me are helping.    Currently in Pain?  Yes    Pain Score  2     Pain Orientation  Right    Pain Descriptors / Indicators  Aching;Discomfort    Pain Type  Acute pain    Pain Onset  1 to 4 weeks ago                       [facility] Adult PT Treatment/Exercise - 09/10/17 0001      Knee/Hip Exercises: Sidelying   Other Sidelying Knee/Hip Exercises  Positional traction in left sdly position over small bolster x 15 minutes.      Modalities   Modalities  Electrical Stimulation;Moist Heat      Moist  Heat Therapy   Number Minutes Moist Heat  20 Minutes    Moist Heat Location  Lumbar Spine      Electrical Stimulation   Electrical Stimulation Location  Right SIJ/Right low back.    Electrical Stimulation Action  Pre-mod.    Electrical Stimulation Parameters  80-150 Hz x 20 minutes.    Electrical Stimulation Goals  Pain      Manual Therapy   Manual Therapy  Soft tissue mobilization    Manual therapy comments  STW/M x 8 minutes to right SIJ including QL release technique.               PT Short Term Goals - 09/02/17 1214      PT SHORT TERM GOAL #1   Title  STG's=LTG's.        PT Long Term Goals - 09/07/17 0943      PT LONG TERM GOAL #1   Title  Independent with a HEP.    Time  8    Period  Weeks    Status  On-going      PT LONG TERM GOAL #  2   Title  Perform ADL's with pain not > 2-3/10.    Time  8    Period  Weeks    Status  On-going      PT LONG TERM GOAL #3   Title  Eliminate right LE symptoms.    Time  8    Period  Weeks    Status  On-going            Plan - 09/10/17 1025    Clinical Impression Statement  Excellent response to treatment thus far.  Reviewed correct posture and draw-in exercise.  Patient complaint to HEP.    Clinical Presentation  Stable    Clinical Decision Making  Low    PT Treatment/Interventions  ADLs/Self Care Home Management;Cryotherapy;Electrical Stimulation;Moist Heat;Ultrasound;Therapeutic activities;Therapeutic exercise;Patient/family education;Manual techniques;Dry needling    PT Next Visit Plan  Please ask patient to get a copy of her CT scan.  Combo e'stim/U/S to right SIJ; STW/M; positional traction; draw-in progression; SKTC and hip bridges.    Consulted and Agree with Plan of Care  Patient       Patient will benefit from skilled therapeutic intervention in order to improve the following deficits and impairments:  Decreased activity tolerance, Pain, Postural dysfunction, Decreased strength  Visit  Diagnosis: Abnormal posture  Acute right-sided low back pain with right-sided sciatica     Problem List Patient Active Problem List   Diagnosis Date Noted  . Distal radius fracture, right 09/02/2013  . Fracture of right ulnar styloid 09/02/2013    Cassie Martinez, [place] MPT 09/10/2017, 10:26 AM  [facility] [address] [place], [place], [address] Phone: [phone]   Fax:  [phone]  Name: Cassie Martinez MRN: [id number] Date of Birth: May 10, 1950

## 2017-09-15 ENCOUNTER — Ambulatory Visit: Payer: Medicare Other | Admitting: Physical Therapy

## 2017-09-15 ENCOUNTER — Encounter: Payer: Self-pay | Admitting: Physical Therapy

## 2017-09-15 DIAGNOSIS — R293 Abnormal posture: Secondary | ICD-10-CM | POA: Diagnosis not present

## 2017-09-15 DIAGNOSIS — M5441 Lumbago with sciatica, right side: Secondary | ICD-10-CM

## 2017-09-15 NOTE — Therapy (Signed)
[facility] [address] [place], [place], [address] Phone: [phone]   Fax:  [phone]  Physical Therapy Treatment  Patient Details  Name: Cassie Martinez MRN: [id number] Date of Birth: January 30, 1951 Referring Provider: Marikay Alar MD   Encounter Date: 09/15/2017  PT End of Session - 09/15/17 0903    Visit Number  4    Number of Visits  16    Date for PT Re-Evaluation  12/01/17    Authorization Type  FOTO AT LEAST EVERY 5TH VISIT, 10TH VISIT PROGRESS NOTE AND KX MODIFIER AFTER THE 15 VISIT.    PT Start Time  0903    PT Stop Time  0951    PT Time Calculation (min)  48 min    Activity Tolerance  Patient tolerated treatment well    Behavior During Therapy  [facility] for tasks assessed/performed       Past Medical History:  Diagnosis Date  . Arthritis   . Diabetes mellitus without complication (HCC)   . Fibromyalgia   . History of melanoma   . Hypertension     Past Surgical History:  Procedure Laterality Date  . CHOLECYSTECTOMY    . MELANOMA EXCISION    . ORIF WRIST FRACTURE Right 09/03/2013   Procedure: OPEN REDUCTION INTERNAL FIXATION (ORIF) WRIST FRACTURE;  Surgeon: Dominica Severin, MD;  Location: [facility];  Service: Orthopedics;  Laterality: Right;  . SKIN CANCER EXCISION     on eye lid    There were no vitals filed for this visit.  Subjective Assessment - 09/15/17 0902    Subjective  Reports standing for long period on concrete while loading granddaughter for camp.    Pertinent History  Arthrits; DM.    Diagnostic tests  CT scan.    Currently in Pain?  Yes    Pain Score  6     Pain Location  Back    Pain Orientation  Right;Lower;Left    Pain Descriptors / Indicators  Discomfort    Pain Type  Acute pain    Pain Radiating Towards  RLE    Pain Onset  1 to 4 weeks ago    Pain Frequency  Intermittent         [facility] PT Assessment - 09/15/17 0001      Assessment   Medical Diagnosis  Spondylolithesis, Lumbar region.       Precautions   Precautions  None      Restrictions   Weight Bearing Restrictions  No                   [facility] Adult PT Treatment/Exercise - 09/15/17 0001      Modalities   Modalities  Electrical Stimulation;Moist Heat;Ultrasound      Moist Heat Therapy   Number Minutes Moist Heat  15 Minutes    Moist Heat Location  Lumbar Spine      Electrical Stimulation   Electrical Stimulation Location  R low back/ SI joint    Electrical Stimulation Action  Pre-Mod    Electrical Stimulation Parameters  80-150 hz x15 min    Electrical Stimulation Goals  Pain      Ultrasound   Ultrasound Location  R SI joint    Ultrasound Parameters  1.5 w/cm2, 100%, 1 mhz x10 min    Ultrasound Goals  Pain      Manual Therapy   Manual Therapy  Soft tissue mobilization    Soft tissue mobilization  STW/MFR to R lumbar paraspinals, SI joint to  reduce muscle tightness and pain               PT Short Term Goals - 09/02/17 1214      PT SHORT TERM GOAL #1   Title  STG's=LTG's.        PT Long Term Goals - 09/07/17 0943      PT LONG TERM GOAL #1   Title  Independent with a HEP.    Time  8    Period  Weeks    Status  On-going      PT LONG TERM GOAL #2   Title  Perform ADL's with pain not > 2-3/10.    Time  8    Period  Weeks    Status  On-going      PT LONG TERM GOAL #3   Title  Eliminate right LE symptoms.    Time  8    Period  Weeks    Status  On-going            Plan - 09/15/17 0956    Clinical Impression Statement  Patient tolerated today's treatment fairly well as she arrived with increased B low back pain from standing for prolonged period yesterday. Normal modalities response noted following removal of the modalities. Minimally increased muscle tightness in superior R glute, paraspinals but no tenderness or soreness reported by patient. Following end of treatment patient reported low back feeling "much better."    Rehab Potential  Excellent    PT Frequency  2x  / week    PT Duration  8 weeks    PT Treatment/Interventions  ADLs/Self Care Home Management;Cryotherapy;Electrical Stimulation;Moist Heat;Ultrasound;Therapeutic activities;Therapeutic exercise;Patient/family education;Manual techniques;Dry needling    PT Next Visit Plan  Please ask patient to get a copy of her CT scan.  Combo e'stim/U/S to right SIJ; STW/M; positional traction; draw-in progression; SKTC and hip bridges.    Consulted and Agree with Plan of Care  Patient       Patient will benefit from skilled therapeutic intervention in order to improve the following deficits and impairments:  Decreased activity tolerance, Pain, Postural dysfunction, Decreased strength  Visit Diagnosis: Abnormal posture  Acute right-sided low back pain with right-sided sciatica     Problem List Patient Active Problem List   Diagnosis Date Noted  . Distal radius fracture, right 09/02/2013  . Fracture of right ulnar styloid 09/02/2013    Marvell Fuller, PTA 09/15/2017, 10:02 AM  [facility] [address] [place], [place], [address] Phone: [phone]   Fax:  [phone]  Name: Cassie Martinez MRN: [id number] Date of Birth: Nov 14, 1950

## 2017-09-23 ENCOUNTER — Encounter: Payer: Self-pay | Admitting: Physical Therapy

## 2017-09-23 ENCOUNTER — Ambulatory Visit: Payer: Medicare Other | Attending: Neurological Surgery | Admitting: Physical Therapy

## 2017-09-23 DIAGNOSIS — M5441 Lumbago with sciatica, right side: Secondary | ICD-10-CM | POA: Insufficient documentation

## 2017-09-23 DIAGNOSIS — R293 Abnormal posture: Secondary | ICD-10-CM | POA: Diagnosis not present

## 2017-09-23 NOTE — Therapy (Addendum)
[facility] [place], [place], [address] Phone: [phone]   Fax:  [phone]  Physical Therapy Treatment  Patient Details  Name: Cassie Martinez MRN: [id number] Date of Birth: 02-02-1951 Referring Provider: Sherley Bounds MD   Encounter Date: 09/23/2017  PT End of Session - 09/23/17 0929    Visit Number  5    Number of Visits  16    Date for PT Re-Evaluation  12/01/17    Authorization Type  FOTO AT LEAST EVERY 5TH VISIT, 10TH VISIT PROGRESS NOTE AND KX MODIFIER AFTER THE 15 VISIT.    PT Start Time  0901    PT Stop Time  [id number]    PT Time Calculation (min)  42 min    Activity Tolerance  Patient tolerated treatment well    Behavior During Therapy  [facility] for tasks assessed/performed       Past Medical History:  Diagnosis Date  . Arthritis   . Diabetes mellitus without complication ([place])   . Fibromyalgia   . History of melanoma   . Hypertension     Past Surgical History:  Procedure Laterality Date  . CHOLECYSTECTOMY    . MELANOMA EXCISION    . ORIF WRIST FRACTURE Right 09/03/2013   Procedure: OPEN REDUCTION INTERNAL FIXATION (ORIF) WRIST FRACTURE;  Surgeon: Roseanne Kaufman, MD;  Location: [facility];  Service: Orthopedics;  Laterality: Right;  . SKIN CANCER EXCISION     on eye lid    There were no vitals filed for this visit.  Subjective Assessment - 09/23/17 0905    Subjective  Patient reported no pain after getting a shot last wednesday in low back    Pertinent History  Arthrits; DM.    Diagnostic tests  CT scan.    Currently in Pain?  No/denies                       [facility] Adult PT Treatment/Exercise - 09/23/17 0001      Lumbar Exercises: Standing   Other Standing Lumbar Exercises  lat pull with pink XTS 2x10      Lumbar Exercises: Seated   Other Seated Lumbar Exercises  seated marching 2x10    Other Seated Lumbar Exercises  seated draw in with scap retractions with red t-band 2x10       Lumbar  Exercises: Supine   Ab Set  20 reps;3 seconds    Glut Set  20 reps;3 seconds    Bent Knee Raise  3 seconds 2x20    Bridge with [employer]  10 reps    Bridge with clamshell  10 reps red t-band    Straight Leg Raise  3 seconds      Knee/Hip Exercises: Aerobic   Nustep  L3 x31mn UE/LE               PT Short Term Goals - 09/02/17 1214      PT SHORT TERM GOAL #1   Title  STG's=LTG's.        PT Long Term Goals - 09/23/17 0931      PT LONG TERM GOAL #1   Title  Independent with a HEP.    Time  8    Period  Weeks    Status  On-going      PT LONG TERM GOAL #2   Title  Perform ADL's with pain not > 2-3/10.    Time  8    Period  Weeks  Status  On-going not doing all ADL's at this time 09/23/17      PT LONG TERM GOAL #3   Title  Eliminate right LE symptoms.    Time  8    Period  Weeks    Status  Achieved no pain or symptoms since she had shot a week ago 09/23/17            Plan - 09/23/17 0933    Clinical Impression Statement  Patient tolerated treatment well today. Patient has reported no pain or symptoms since her shot in back last wednesday. Patient able to progress with core activation exercises today from supine, sitting and standing with good form and understanding. Patient met goal #3 today. Patient will be able to progress per tolerance to increase core and posture strengthening to increase stabilization and functional independence.    Rehab Potential  Excellent    Clinical Impairments Affecting Rehab Potential  FOTO 5th visit 26% current (initial 63%)    PT Frequency  2x / week    PT Duration  8 weeks    PT Treatment/Interventions  ADLs/Self Care Home Management;Cryotherapy;Electrical Stimulation;Moist Heat;Ultrasound;Therapeutic activities;Therapeutic exercise;Patient/family education;Manual techniques;Dry needling    PT Next Visit Plan  cont with core progression per tolerance and modalities PRN    Consulted and Agree with Plan of Care  Patient        Patient will benefit from skilled therapeutic intervention in order to improve the following deficits and impairments:  Decreased activity tolerance, Pain, Postural dysfunction, Decreased strength  Visit Diagnosis: Abnormal posture  Acute right-sided low back pain with right-sided sciatica     Problem List Patient Active Problem List   Diagnosis Date Noted  . Distal radius fracture, right 09/02/2013  . Fracture of right ulnar styloid 09/02/2013    Ladean Raya, PTA 09/23/17 9:48 AM  [facility] [place], [place], [address] Phone: [phone]   Fax:  [phone]  Name: Cassie Martinez MRN: [id number] Date of Birth: [birth year]  PHYSICAL THERAPY DISCHARGE SUMMARY  Visits from Start of Care: 5.  Current functional level related to goals / functional outcomes: See above.   Remaining deficits: See LTG section.   Education / Equipment: HEP. Plan: Patient agrees to discharge.  Patient goals were partially met. Patient is being discharged due to not returning since the last visit.  ?????         Mali Applegate MPT

## 2018-06-29 ENCOUNTER — Encounter: Payer: Self-pay | Admitting: Gastroenterology
